# Patient Record
Sex: Female | Born: 1950 | Race: White | Hispanic: No | Smoking: Never smoker
Health system: Southern US, Community
[De-identification: ages and names within clinical notes are randomized; demographics above are authoritative.]

## PROBLEM LIST (undated history)

## (undated) DIAGNOSIS — E785 Hyperlipidemia, unspecified: Secondary | ICD-10-CM

## (undated) DIAGNOSIS — I471 Supraventricular tachycardia, unspecified: Secondary | ICD-10-CM

## (undated) DIAGNOSIS — R195 Other fecal abnormalities: Secondary | ICD-10-CM

## (undated) DIAGNOSIS — I499 Cardiac arrhythmia, unspecified: Secondary | ICD-10-CM

## (undated) DIAGNOSIS — D62 Acute posthemorrhagic anemia: Secondary | ICD-10-CM

## (undated) DIAGNOSIS — K219 Gastro-esophageal reflux disease without esophagitis: Secondary | ICD-10-CM

## (undated) DIAGNOSIS — I10 Essential (primary) hypertension: Secondary | ICD-10-CM

## (undated) DIAGNOSIS — C801 Malignant (primary) neoplasm, unspecified: Secondary | ICD-10-CM

## (undated) DIAGNOSIS — F32A Depression, unspecified: Secondary | ICD-10-CM

## (undated) DIAGNOSIS — Z8619 Personal history of other infectious and parasitic diseases: Secondary | ICD-10-CM

## (undated) DIAGNOSIS — D649 Anemia, unspecified: Secondary | ICD-10-CM

## (undated) DIAGNOSIS — M810 Age-related osteoporosis without current pathological fracture: Secondary | ICD-10-CM

## (undated) DIAGNOSIS — K922 Gastrointestinal hemorrhage, unspecified: Secondary | ICD-10-CM

## (undated) DIAGNOSIS — K5792 Diverticulitis of intestine, part unspecified, without perforation or abscess without bleeding: Secondary | ICD-10-CM

## (undated) HISTORY — PX: COLONOSCOPY: SHX174

## (undated) HISTORY — PX: ESOPHAGOGASTRODUODENOSCOPY: SHX1529

## (undated) HISTORY — PX: TOTAL HIP ARTHROPLASTY: SHX124

## (undated) HISTORY — PX: FRACTURE SURGERY: SHX138

## (undated) HISTORY — PX: JOINT REPLACEMENT: SHX530

## (undated) HISTORY — PX: ANKLE FRACTURE SURGERY: SHX122

## (undated) HISTORY — PX: OTHER SURGICAL HISTORY: SHX169

---

## 1998-03-08 HISTORY — PX: CARDIAC ELECTROPHYSIOLOGY MAPPING AND ABLATION: SHX1292

## 2004-01-17 ENCOUNTER — Ambulatory Visit: Payer: Self-pay | Admitting: Unknown Physician Specialty

## 2004-11-24 ENCOUNTER — Ambulatory Visit: Payer: Self-pay | Admitting: Unknown Physician Specialty

## 2005-03-30 ENCOUNTER — Ambulatory Visit: Payer: Self-pay | Admitting: General Practice

## 2006-04-07 ENCOUNTER — Ambulatory Visit: Payer: Self-pay | Admitting: General Practice

## 2007-08-10 ENCOUNTER — Ambulatory Visit: Payer: Self-pay | Admitting: Unknown Physician Specialty

## 2007-08-23 ENCOUNTER — Ambulatory Visit: Payer: Self-pay | Admitting: Unknown Physician Specialty

## 2008-09-27 ENCOUNTER — Ambulatory Visit: Payer: Self-pay | Admitting: Unknown Physician Specialty

## 2009-04-29 ENCOUNTER — Ambulatory Visit: Payer: Self-pay | Admitting: Unknown Physician Specialty

## 2010-03-10 ENCOUNTER — Ambulatory Visit: Payer: Self-pay | Admitting: Unknown Physician Specialty

## 2010-11-04 ENCOUNTER — Ambulatory Visit: Payer: Self-pay | Admitting: Unknown Physician Specialty

## 2011-12-29 ENCOUNTER — Ambulatory Visit: Payer: Self-pay | Admitting: Internal Medicine

## 2011-12-31 ENCOUNTER — Ambulatory Visit: Payer: Self-pay | Admitting: Internal Medicine

## 2012-01-19 ENCOUNTER — Ambulatory Visit: Payer: Self-pay | Admitting: Obstetrics & Gynecology

## 2012-06-14 ENCOUNTER — Ambulatory Visit: Payer: Self-pay | Admitting: Anesthesiology

## 2012-07-05 ENCOUNTER — Ambulatory Visit: Payer: Self-pay | Admitting: Anesthesiology

## 2012-08-10 ENCOUNTER — Ambulatory Visit: Payer: Self-pay | Admitting: Anesthesiology

## 2012-09-06 ENCOUNTER — Ambulatory Visit: Payer: Self-pay | Admitting: Anesthesiology

## 2012-10-16 ENCOUNTER — Ambulatory Visit: Payer: Self-pay | Admitting: Anesthesiology

## 2013-03-07 ENCOUNTER — Ambulatory Visit: Payer: Self-pay | Admitting: General Practice

## 2013-03-07 DIAGNOSIS — I10 Essential (primary) hypertension: Secondary | ICD-10-CM

## 2013-03-07 LAB — URINALYSIS, COMPLETE
Bacteria: NONE SEEN
Bilirubin,UR: NEGATIVE
Blood: NEGATIVE
Ketone: NEGATIVE
Leukocyte Esterase: NEGATIVE
Ph: 5 (ref 4.5–8.0)
Protein: NEGATIVE
Squamous Epithelial: NONE SEEN
WBC UR: 4 /HPF (ref 0–5)

## 2013-03-07 LAB — BASIC METABOLIC PANEL
Anion Gap: 5 — ABNORMAL LOW (ref 7–16)
Calcium, Total: 9.2 mg/dL (ref 8.5–10.1)
Co2: 28 mmol/L (ref 21–32)
Creatinine: 0.78 mg/dL (ref 0.60–1.30)
EGFR (African American): >60
EGFR (Non-African Amer.): >60
Sodium: 135 mmol/L — ABNORMAL LOW (ref 136–145)

## 2013-03-07 LAB — MRSA PCR SCREENING

## 2013-03-07 LAB — PROTIME-INR: Prothrombin Time: 12.6 secs (ref 11.5–14.7)

## 2013-03-07 LAB — SEDIMENTATION RATE: Erythrocyte Sed Rate: 31 mm/hr — ABNORMAL HIGH (ref 0–30)

## 2013-03-07 LAB — CBC
HCT: 37.9 % (ref 35.0–47.0)
MCH: 27.3 pg (ref 26.0–34.0)
Platelet: 318 10*3/uL (ref 150–440)
RDW: 13.2 % (ref 11.5–14.5)

## 2013-03-08 LAB — URINE CULTURE

## 2013-03-16 ENCOUNTER — Emergency Department: Payer: Self-pay | Admitting: Emergency Medicine

## 2013-03-16 LAB — CBC
HCT: 36.2 % (ref 35.0–47.0)
HGB: 12.2 g/dL (ref 12.0–16.0)
MCH: 27.9 pg (ref 26.0–34.0)
MCHC: 33.7 g/dL (ref 32.0–36.0)
MCV: 83 fL (ref 80–100)
Platelet: 322 10*3/uL (ref 150–440)
RBC: 4.37 10*6/uL (ref 3.80–5.20)
RDW: 13.2 % (ref 11.5–14.5)
WBC: 10.7 10*3/uL (ref 3.6–11.0)

## 2013-03-16 LAB — COMPREHENSIVE METABOLIC PANEL
ALK PHOS: 78 U/L
ALT: 16 U/L (ref 12–78)
Albumin: 3.6 g/dL (ref 3.4–5.0)
Anion Gap: 6 — ABNORMAL LOW (ref 7–16)
BUN: 27 mg/dL — AB (ref 7–18)
Bilirubin,Total: 0.3 mg/dL (ref 0.2–1.0)
CHLORIDE: 101 mmol/L (ref 98–107)
Calcium, Total: 9 mg/dL (ref 8.5–10.1)
Co2: 28 mmol/L (ref 21–32)
Creatinine: 1.07 mg/dL (ref 0.60–1.30)
EGFR (African American): >60
EGFR (Non-African Amer.): 56 — ABNORMAL LOW
Glucose: 99 mg/dL (ref 65–99)
Osmolality: 275 (ref 275–301)
POTASSIUM: 3.7 mmol/L (ref 3.5–5.1)
SGOT(AST): 24 U/L (ref 15–37)
Sodium: 135 mmol/L — ABNORMAL LOW (ref 136–145)
TOTAL PROTEIN: 7.6 g/dL (ref 6.4–8.2)

## 2013-03-16 LAB — CK TOTAL AND CKMB (NOT AT ARMC)
CK, Total: 53 U/L (ref 21–215)
CK-MB: <0.5 ng/mL — ABNORMAL LOW (ref 0.5–3.6)

## 2013-03-16 LAB — PROTIME-INR
INR: 1
Prothrombin Time: 13 secs (ref 11.5–14.7)

## 2013-03-16 LAB — APTT: ACTIVATED PTT: 30.3 s (ref 23.6–35.9)

## 2013-03-16 LAB — TROPONIN I: Troponin-I: <0.02 ng/mL

## 2013-03-21 ENCOUNTER — Inpatient Hospital Stay: Payer: Self-pay | Admitting: General Practice

## 2013-03-22 LAB — BASIC METABOLIC PANEL
Anion Gap: 3 — ABNORMAL LOW (ref 7–16)
BUN: 15 mg/dL (ref 7–18)
CALCIUM: 8.1 mg/dL — AB (ref 8.5–10.1)
CHLORIDE: 104 mmol/L (ref 98–107)
Co2: 29 mmol/L (ref 21–32)
Creatinine: 0.68 mg/dL (ref 0.60–1.30)
EGFR (African American): >60
Glucose: 105 mg/dL — ABNORMAL HIGH (ref 65–99)
Osmolality: 273 (ref 275–301)
POTASSIUM: 4.4 mmol/L (ref 3.5–5.1)
SODIUM: 136 mmol/L (ref 136–145)

## 2013-03-22 LAB — HEMOGLOBIN: HGB: 8.8 g/dL — ABNORMAL LOW (ref 12.0–16.0)

## 2013-03-22 LAB — PLATELET COUNT: Platelet: 222 10*3/uL (ref 150–440)

## 2013-03-23 LAB — PLATELET COUNT: PLATELETS: 213 10*3/uL (ref 150–440)

## 2013-03-23 LAB — PATHOLOGY REPORT

## 2013-03-23 LAB — BASIC METABOLIC PANEL
Anion Gap: 4 — ABNORMAL LOW (ref 7–16)
BUN: 10 mg/dL (ref 7–18)
CO2: 28 mmol/L (ref 21–32)
Calcium, Total: 8.2 mg/dL — ABNORMAL LOW (ref 8.5–10.1)
Chloride: 105 mmol/L (ref 98–107)
Creatinine: 0.66 mg/dL (ref 0.60–1.30)
EGFR (Non-African Amer.): >60
GLUCOSE: 99 mg/dL (ref 65–99)
OSMOLALITY: 273 (ref 275–301)
Potassium: 3.5 mmol/L (ref 3.5–5.1)
Sodium: 137 mmol/L (ref 136–145)

## 2013-03-23 LAB — HEMOGLOBIN: HGB: 8.5 g/dL — ABNORMAL LOW (ref 12.0–16.0)

## 2013-06-28 ENCOUNTER — Encounter: Payer: Self-pay | Admitting: General Practice

## 2013-07-06 ENCOUNTER — Encounter: Payer: Self-pay | Admitting: General Practice

## 2013-08-06 ENCOUNTER — Encounter: Payer: Self-pay | Admitting: General Practice

## 2013-12-19 ENCOUNTER — Ambulatory Visit: Payer: Self-pay | Admitting: Internal Medicine

## 2014-03-28 DIAGNOSIS — R195 Other fecal abnormalities: Secondary | ICD-10-CM

## 2014-03-28 HISTORY — DX: Other fecal abnormalities: R19.5

## 2014-05-06 ENCOUNTER — Ambulatory Visit: Payer: Self-pay | Admitting: Unknown Physician Specialty

## 2014-06-29 NOTE — Op Note (Signed)
PATIENT NAME:  Jodi Allison, Jodi Allison MR#:  [id number] DATE OF BIRTH:  1951/02/24  DATE OF PROCEDURE:  03/21/2013  PREOPERATIVE DIAGNOSIS: Degenerative arthrosis of the left hip.   POSTOPERATIVE DIAGNOSIS: Degenerative arthrosis of the left knee.   PROCEDURE PERFORMED: Left total hip arthroplasty.   SURGEON: Laurice Record. Hooten, M.D.   ASSISTANT:  Vance Peper, PA (required to maintain retraction throughout the procedure).   ANESTHESIA: Spinal.   ESTIMATED BLOOD LOSS: 250 mL.   FLUIDS REPLACED: 2300 mL of crystalloid.   DRAINS: Two medium drains to a Hemovac reservoir.   IMPLANTS UTILIZED: DePuy 13.5 mm small stature AML femoral stem, 50 mm outer diameter Pinnacle 100 acetabular component, a +4 mm 10-degree Pinnacle Marathon polyethylene liner and a 32 mm cobalt chrome hip ball with a +1 mm neck length.   INDICATIONS FOR SURGERY: The patient is a 64 year old female who has been seen for complaints of progressive left hip and groin pain. X-rays demonstrated severe degenerative changes with full-thickness loss of cartilage superiorly and significant osteophyte formation. After discussion of the risks and benefits of surgical intervention, the patient expressed understanding of the risks, benefits and agreed with plans for surgical intervention.   PROCEDURE IN DETAIL: The patient was brought into the operating room and, after adequate spinal anesthesia was achieved, the patient was placed in a right lateral decubitus position. Axillary roll was placed, and all bony prominences were well padded. The patient's left hip and leg were cleaned and prepped with alcohol and DuraPrep draped in the usual sterile fashion. A "timeout" was performed as per usual protocol. A lateral curvilinear incision was made, gently curving towards the posterior superior iliac spine. IT band was incised in line with the skin incisions. Fibers of the gluteus maximus were split in line. Piriformis tendon was identified, skeletonized and  incised at its insertion of the proximal femur and reflected posteriorly. In a similar fashion, the short external rotators were incised and reflected posteriorly. A T-type posterior capsulotomy was performed. Prior to dislocation of the femoral head, a threaded Steinmann pin was inserted through a separate stab incision into the pelvis superior to the acetabulum and then bent in the form of a stylus so as to assess limb length and hip offset throughout the procedure. The femoral head was then dislocated posteriorly. Inspection of the femoral head demonstrated severe degenerative changes. Femoral neck cut was performed using an oscillating saw. The anterior capsule was elevated off of the femoral neck. Attention was then directed to the acetabulum. The remnant of labrum was excised using electrocautery. Inspection of the acetabulum demonstrated significant degenerative changes with prominent anterior osteophytes. The acetabulum was reamed in a sequential fashion up to a 49 mm diameter. This allowed for excellent punctate bleeding bone. A 50 mm outer diameter Pinnacle 100 acetabular component was positioned and impacted into place. Excellent scratch fit was appreciated. A +4 neutral polyethylene was inserted provisionally, and attention was redirected to the proximal femur. Pilot hole for reaming of the proximal femoral canal was created using a high-speed bur. Proximal femoral canal was reamed in a sequential fashion up to a 13 mm diameter. This allowed for approximately 5.5 cm of scratch fit. The proximal portion of the femur was then prepared using a 13.5 mm aggressive side-biting reamer. Serial broaches were inserted up to a 13.5 mm small stature broach. The calcar region was planed and trial reduction was performed with a 32 mm hip ball with a +1 mm neck segment. Excellent stability was noted. However,  it was felt that there was slight lengthening of leg. Trial components were removed and an additional cut was  made using an oscillating saw. Broach was countersunk and calcar in area was planed accordingly. Trial reduction was again performed with a 32 mm hip ball with a +1 mm neck segment. Good maintenance of hip offset and equalization of limb lengths was appreciated. Excellent stability was appreciated both anteriorly and posteriorly. However, it was elected to trial with the +4 mm 10-degree offset with high side at approximately 4 o'clock position. This allowed for improved posterior stability with extremes of adduction, flexion and internal rotation. Trial components were removed. The acetabular shell was irrigated with normal saline with antibiotic solution and then suctioned dry. A +4 mm 10-degree Pinnacle Marathon polyethylene liner was positioned and impacted into place with high side at approximately 4 o'clock position. Next, a 13.5 mm small stature AML femoral component was positioned and impacted into place. Excellent scratch fit was appreciated. The Morse taper was cleaned and dried. A 32 mm cobalt chrome hip ball with a +1 mm neck length was placed on the trunnion and impacted into place. The hip was reduced and placed through a range of motion. Excellent stability was appreciated both anteriorly and posteriorly. Good equalization of limb lengths and hip offset was appreciated. The wound was irrigated with copious amounts of normal saline with antibiotic solution using pulsatile lavage and then suctioned dry. Good hemostasis was appreciated. The posterior capsulotomy was repaired using #5 Ethibond. The piriformis tendon was reapproximated on the surface of the gluteus medius tendon using #5 Ethibond. Two medium drains were placed in the wound bed and brought out through a separate stab incision to be attached to a Hemovac reservoir. IT band was repaired using interrupted sutures of #1 Vicryl. The subcutaneous tissue was approximated in  layers using first #0 Vicryl followed by 2-0 Vicryl. Skin was closed with  skin staples. A sterile dressing was applied.   The patient tolerated the procedure well. She was transported to the recovery room in stable condition.     ____________________________ Laurice Record. Holley Bouche., MD jph:dmm D: 03/21/2013 20:44:00 ET T: 03/21/2013 21:25:26 ET JOB#: [id number]  cc: Laurice Record. Holley Bouche., MD, <Dictator> Laurice Record Holley Bouche MD ELECTRONICALLY SIGNED 03/25/2013 23:46

## 2014-06-29 NOTE — Discharge Summary (Signed)
PATIENT NAME:  Jodi Allison, Jodi Allison MR#:  [id number] DATE OF BIRTH:  1950-08-21  DATE OF ADMISSION:  03/21/2013 DATE OF DISCHARGE: 03/23/2013   ADMITTING DIAGNOSIS: Degenerative arthrosis of the left hip.   DISCHARGE DIAGNOSIS: Degenerative arthrosis of the left hip.   HISTORY: The patient is a 64 year old, who has been followed at [facility] for progression of left hip pain. The patient reported an approximately 2 year history of progressive left groin and hip pain. The pain was noted to be aggravated with weight-bearing activities. She has also noticed some decrease in her left hip range of motion. The pain tended to be aggravated by arising from a sitting position as well as prolonged weight-bearing activities. The patient states that she has developed a significant limp. On occasion, the patient had gone to using a cane for ambulation. She had not seen any significant improvement in her condition despite activity modification as well as use of anti-inflammatory medications, specifically Meloxicam. The patient states that the pain had progressed to the point that it was significantly interfering with her activities of daily living. X-rays  taken in [place] showed slight hyperplastic acetabulum. She was noted to be edge loading superiorly on the lateral acetabulum. She was noted to be bone-on-bone. Significant narrowing was noted. There was significant subchondral sclerosis as well as cystic changes and osteophyte formation. After discussion of the risks and benefits of surgical intervention, the patient expressed her understanding of the risks and benefits and agreed with plans surgical intervention.   PROCEDURE: Left total hip arthroplasty.   ANESTHESIA: Spinal.   IMPLANTS UTILIZED: DePuy 13.5 mm small stature AML femoral stem, 50 mm outer diameter Pinnacle 100 acetabular component, a +4 mm 10 degree Pinnacle Marathon polyethylene liner and a 32 mm cobalt chrome hip ball with a + 1  mm neck length.   HOSPITAL COURSE: The patient tolerated the procedure very well. She had no complications. She was then taken to the PAC-U where she was stabilized and then transferred to the orthopedic floor. The patient began receiving anticoagulation therapy of Lovenox 30 mg subcutaneous q. 12 hours per anesthesia and pharmacy protocol. She was fitted with TED stockings bilaterally. These were allowed to be removed 1 hour per 8 hour shift. The patient was also fitted with the AV-I compression foot pumps set at 80 mmHg. Her calves have been nontender. There has been no evidence of any DVTs. Negative Homans sign. Heels were elevated off the bed using rolled towels. Sacral pad was applied preoperatively.   The patient has denied any chest pain or shortness of breath. Vital signs have been stable. She was noted to have a slight decrease in her blood pressure initially, but this returned to normal very quickly without any modification of her medications or fluids. Hemodynamically, she was stable. No transfusions were needed.   Physical therapy was initiated on day 1 for gait training and transfers. Upon being discharged, she was ambulating greater than 200 feet. She was able go up and down 4 sets of steps. She was independent with bed to chair transfers. Occupational therapy was also initiated on day 1 for ADLs and assistive devices.   The patient's IV, Foley and Hemovac discontinued on day 2 along with a dressing change. The wound was free of any drainage or any signs of infection.   DISPOSITION: The patient is being discharged to home in improved stable condition.   DISCHARGE INSTRUCTIONS: She will continue weight-bearing as tolerated. Continue to use a walker until  cleared by physical therapy to go to a quad cane. She will receive home health PT. Continue with TED stockings bilaterally. These are allowed to be removed at night, but are to be worn during the day. Elevate the lower extremity,  specifically heels off the bed. Encourage the patient to continue with incentive spirometer q.1 hour while awake as well as cough, deep breathing q.2 hours while awake. Posterior hip precautions were explained to the patient. She is placed on a regular diet. Staples are to be removed on 01/28 following the application of benzoin and Steri-Strips. Change dressing as needed. She is not to take a shower until staples are removed. She does have a followup appointment in [facility] on February 26 at 10:15.  She is to call the clinic sooner if any temperatures of 101.5 or greater or excessive bleeding. She is to resume her regular medication that she was on prior to admission. She was given a prescription for Lovenox 40 mg subcutaneously daily for 14 days, then discontinue and begin taking one 81 mg enteric-coated aspirin. A prescription for oxycodone 5 to 10 mg every q.4 to 6 hours p.r.n. for pain, tramadol 50 to 100 mg q.4 to 6 hours p.r.n. for pain and Tylenol ES may be given as well.   PAST MEDICAL HISTORY:  1.  Shingles.  2.  Hypertension.  3. SVT, treated with AV node ablation.  4.  Osteoporosis.  ____________________________ Vance Peper, PA [email or web address]:aw D: 03/23/2013 07:32:10 ET T: 03/23/2013 07:46:40 ET JOB#: [id number]  cc: Vance Peper, PA, <Dictator> [place]raine PA ELECTRONICALLY SIGNED 03/27/2013 7:29

## 2014-06-29 NOTE — Discharge Summary (Signed)
ADDENDUM  PATIENT NAME:  Jodi Allison, Jodi Allison MR#:  [id number] DATE OF BIRTH:  [birth year]  DATE OF ADMISSION:  03/21/2013 DATE OF DISCHARGE:  03/24/2013  The patient's date of discharge was dictated as 03/23/2013 and it should have been 03/24/2013. ____________________________ Vance Peper, PA jrw:aw D: 03/30/2013 08:16:40 ET T: 03/30/2013 08:29:26 ET JOB#: [id number]  cc: Vance Peper, PA, <Dictator> Temari Schooler PA ELECTRONICALLY SIGNED 04/03/2013 7:12

## 2014-11-13 ENCOUNTER — Other Ambulatory Visit: Payer: Self-pay | Admitting: Internal Medicine

## 2014-11-13 DIAGNOSIS — Z1231 Encounter for screening mammogram for malignant neoplasm of breast: Secondary | ICD-10-CM

## 2014-12-26 ENCOUNTER — Ambulatory Visit
Admission: RE | Admit: 2014-12-26 | Discharge: 2014-12-26 | Disposition: A | Discharge status: Home / Self Care | Payer: 59 | Source: Ambulatory Visit | Attending: Internal Medicine | Admitting: Internal Medicine

## 2014-12-26 ENCOUNTER — Other Ambulatory Visit: Payer: Self-pay | Admitting: Internal Medicine

## 2014-12-26 DIAGNOSIS — Z1231 Encounter for screening mammogram for malignant neoplasm of breast: Secondary | ICD-10-CM | POA: Insufficient documentation

## 2014-12-26 DIAGNOSIS — N63 Unspecified lump in breast: Secondary | ICD-10-CM | POA: Insufficient documentation

## 2015-01-21 IMAGING — CT CT HEAD WITHOUT CONTRAST
1 series · 16 of 29 positions shown, 20 images · non-contrast
Comparison: None.

CLINICAL DATA: Syncopal episode

EXAM:
CT HEAD WITHOUT CONTRAST
TECHNIQUE: Contiguous axial images were obtained from the base of the skull
through the vertex without intravenous contrast.

[Series 2: soft tissue · axial · 0.42mm/px · z∈[-142,-12]mm · 16 of 29 slices shown, 20 images]
[im 2/29  brain]
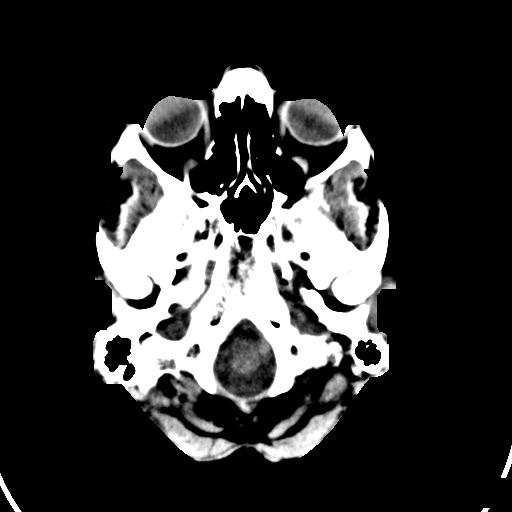
[im 2/29  bone]
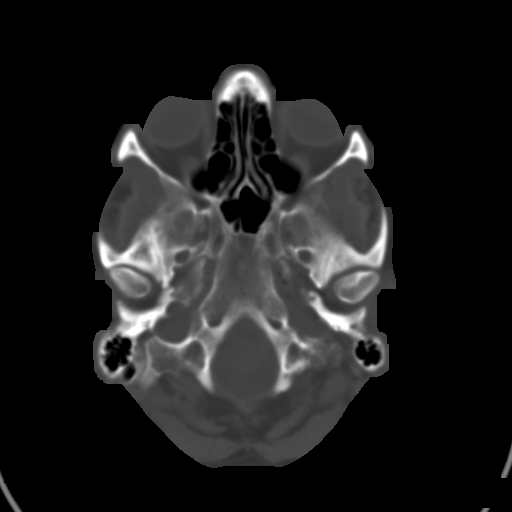
[im 4/29  brain]
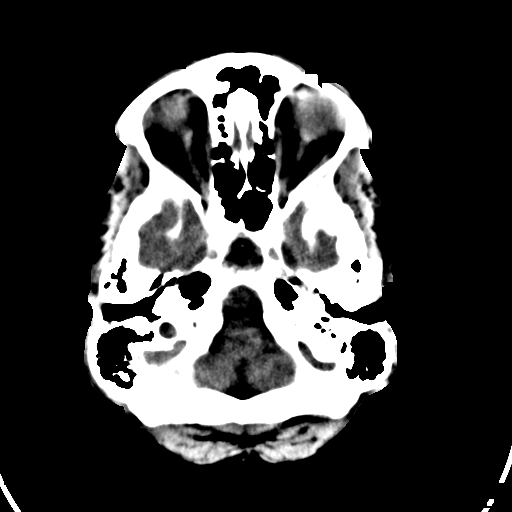
[im 6/29  brain]
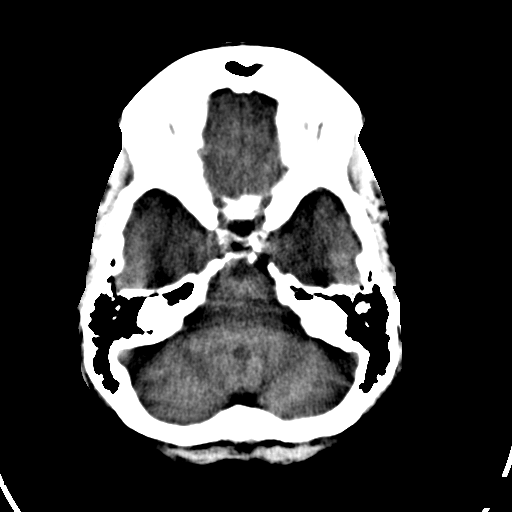
[im 7/29  brain]
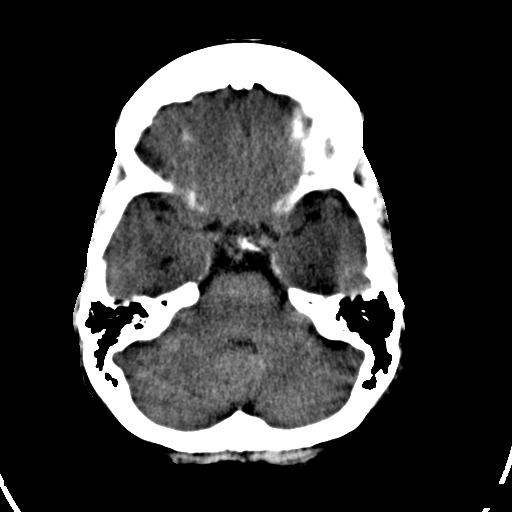
[im 9/29  brain]
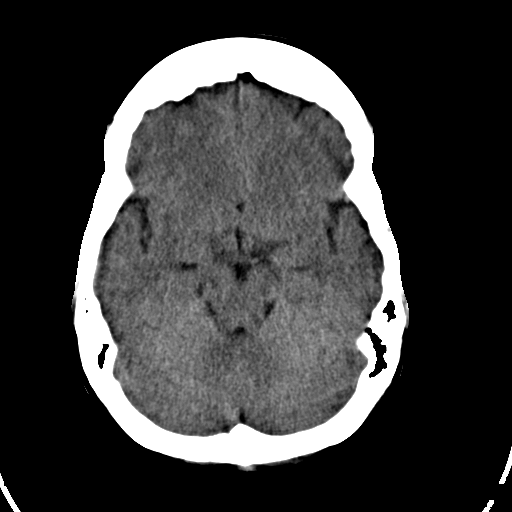
[im 9/29  bone]
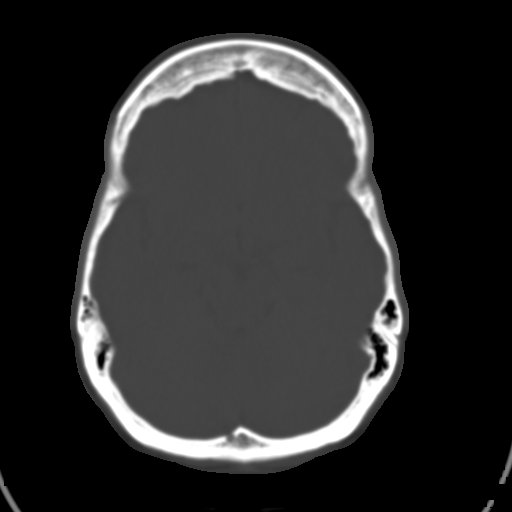
[im 11/29  brain]
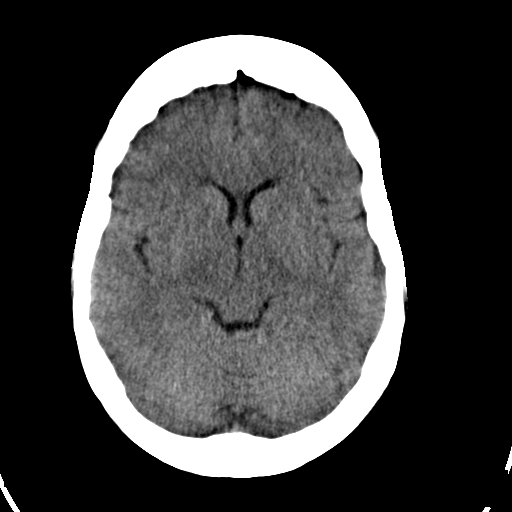
[im 12/29  brain]
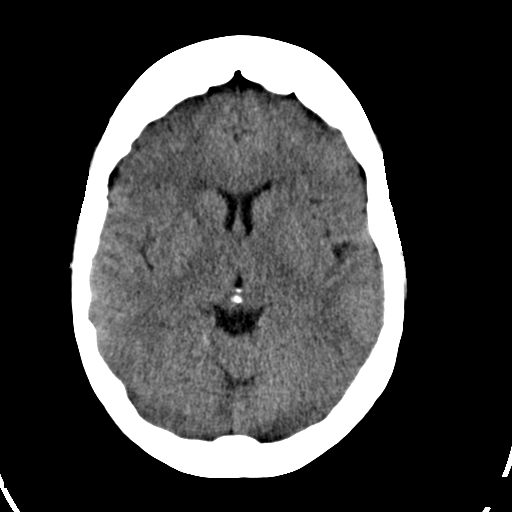
[im 14/29  brain]
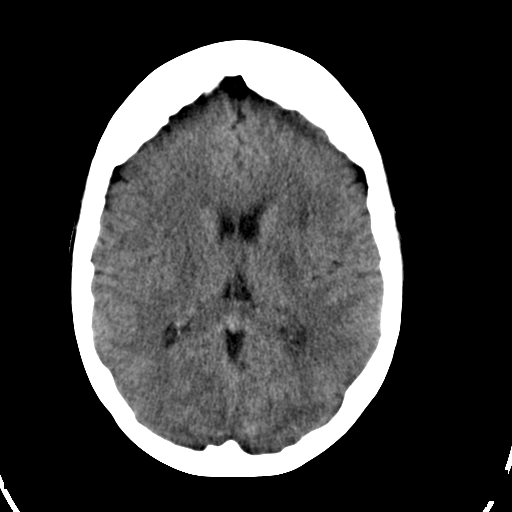
[im 16/29  brain]
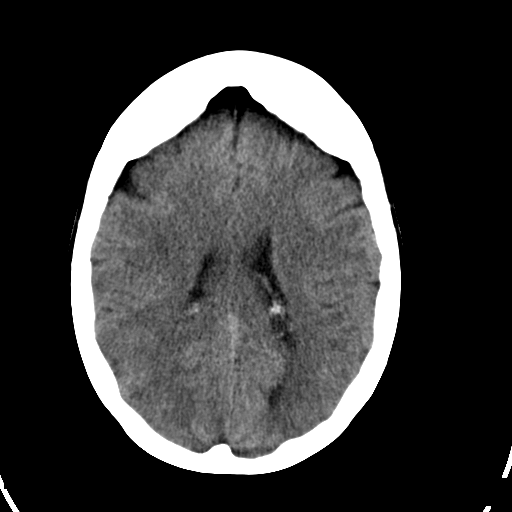
[im 16/29  bone]
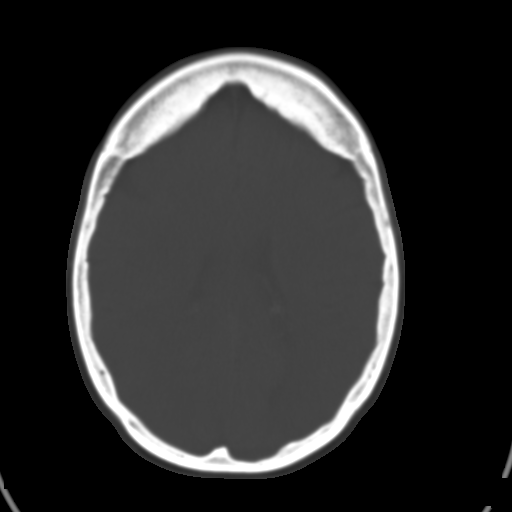
[im 18/29  brain]
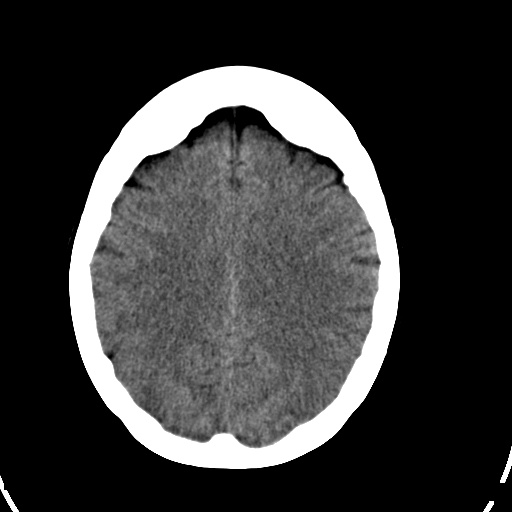
[im 19/29  brain]
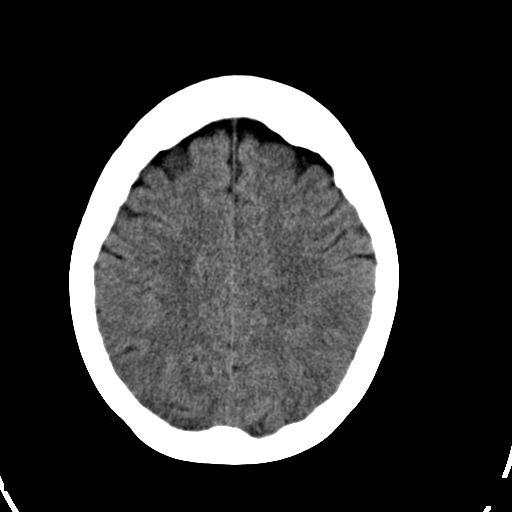
[im 21/29  brain]
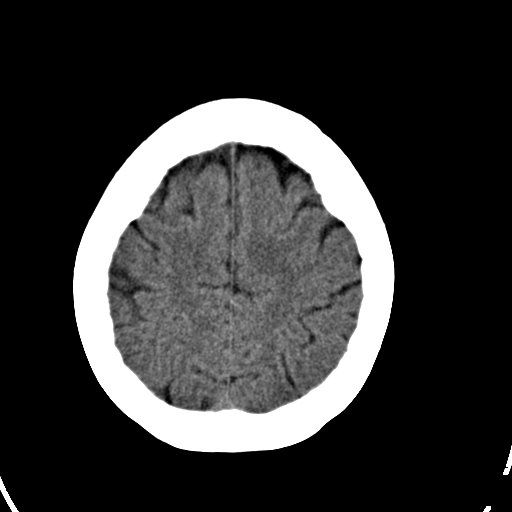
[im 23/29  brain]
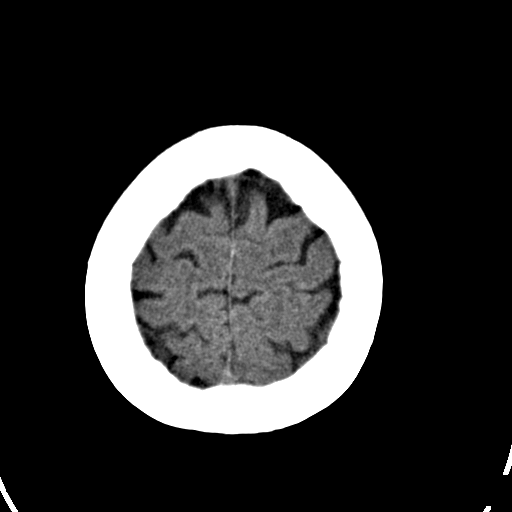
[im 23/29  bone]
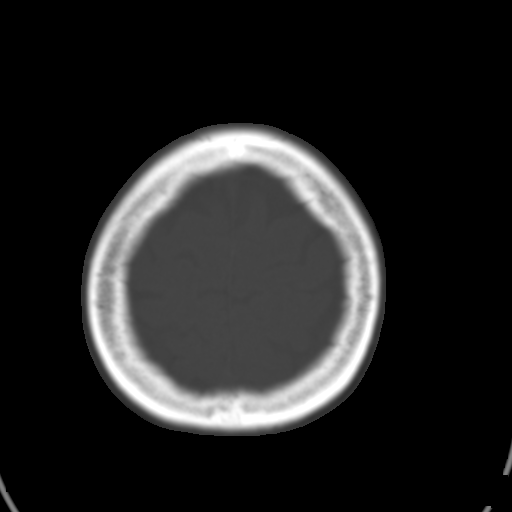
[im 24/29  brain]
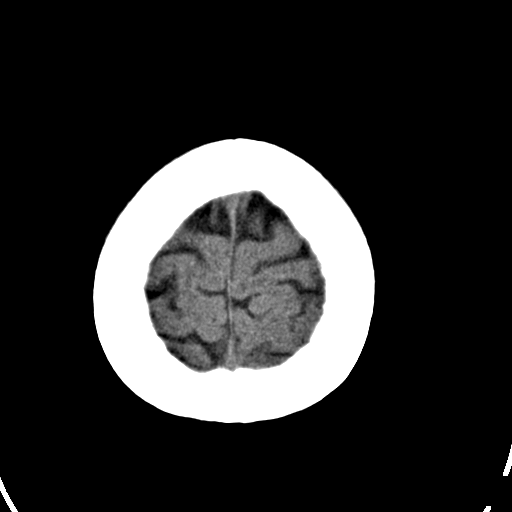
[im 26/29  brain]
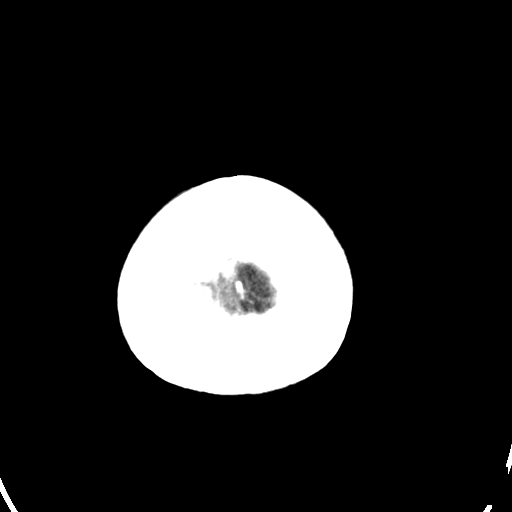
[im 28/29  brain]
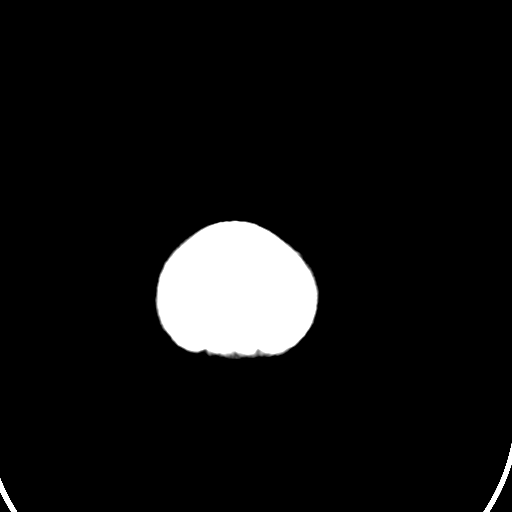

[16 of 29 positions shown; findings below may reference images not displayed]

FINDINGS: Bony calvarium is intact. The ventricles are of normal size and
configuration. Some scattered areas of decreased attenuation are
noted in the region of the anterior limb of the internal capsule
consistent with prior lacunar infarct. No findings to suggest acute
hemorrhage, acute infarction or space-occupying mass lesion are
noted.
IMPRESSION: Chronic changes without acute abnormality.

## 2015-01-21 IMAGING — CR DG CHEST 1V PORT
1 series · 1 of 1 positions shown · non-contrast
Comparison: None.

CLINICAL DATA: Chest pain

EXAM:
PORTABLE CHEST - 1 VIEW

[ap]
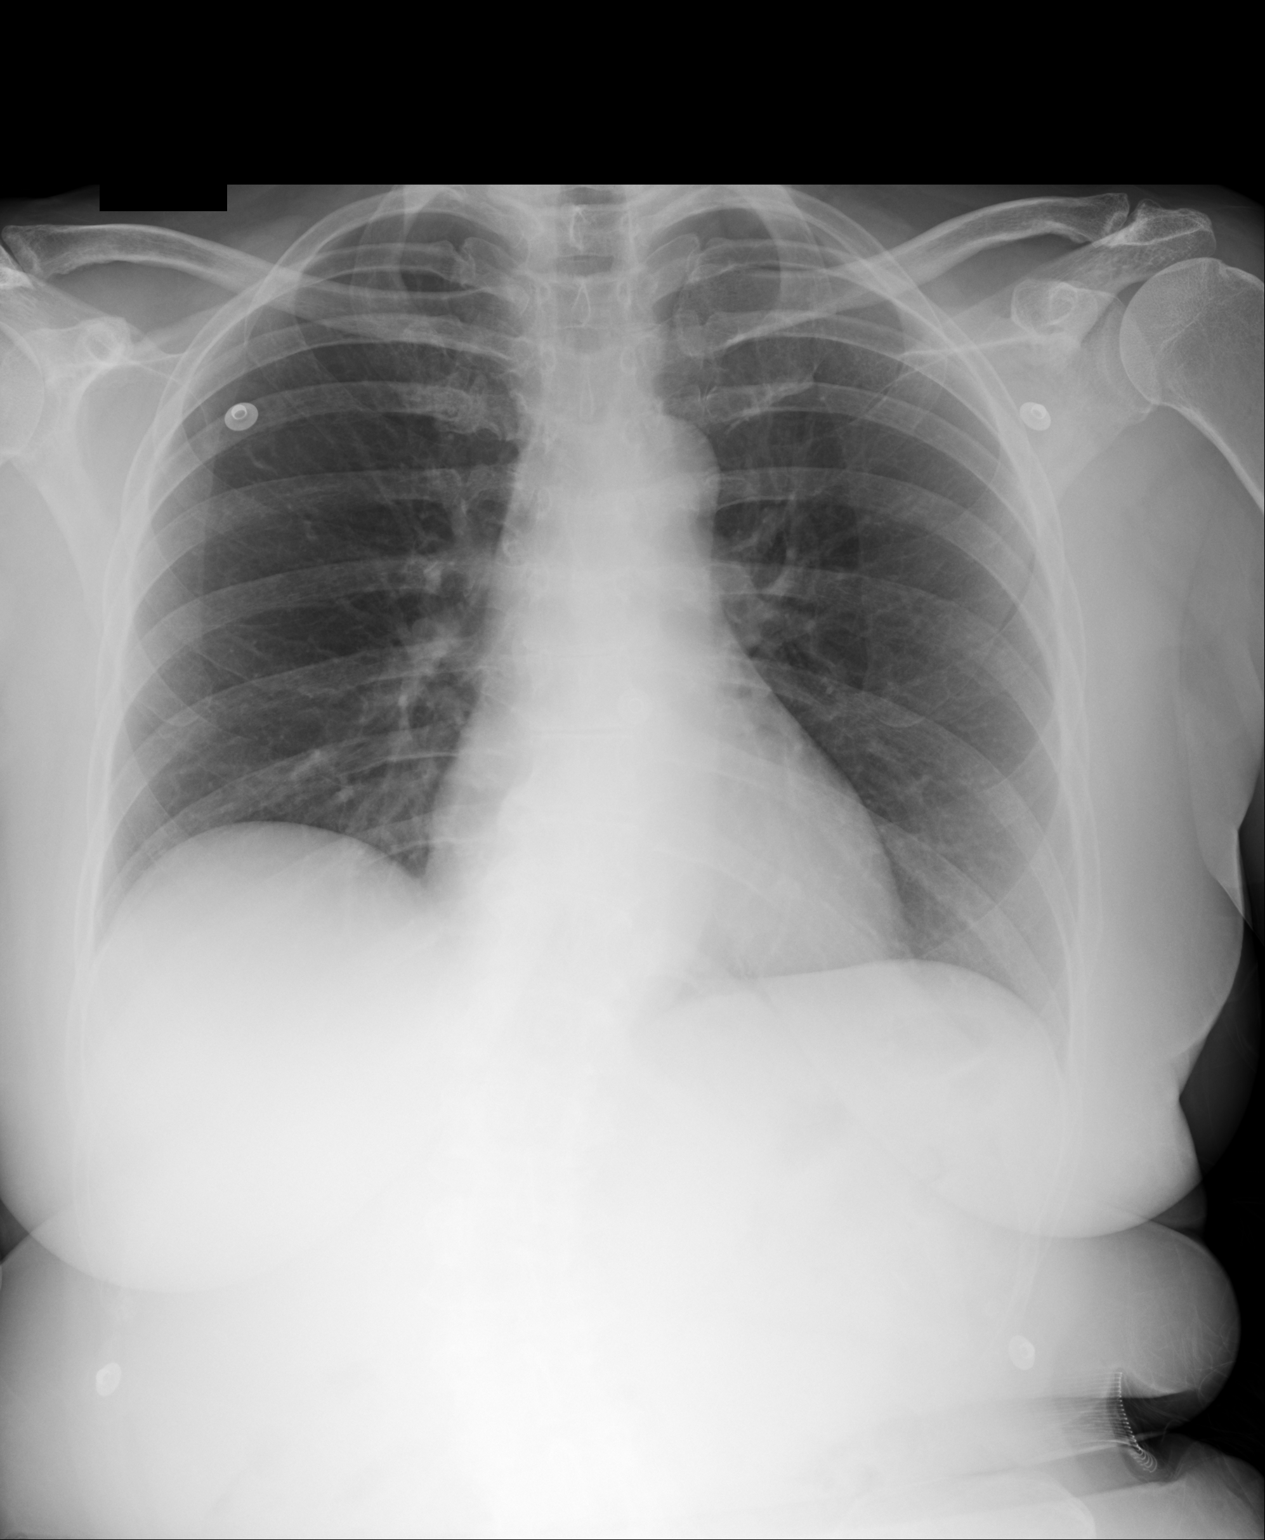

[1 of 1 positions shown; findings below may reference images not displayed]

FINDINGS: The lungs are clear and negative for focal airspace consolidation,
pulmonary edema or suspicious pulmonary nodule. No pleural effusion
or pneumothorax. Cardiac and mediastinal contours are within normal
limits. No acute fracture or lytic or blastic osseous lesions.
Dextro convex scoliosis of the lumbar spine. The visualized upper
abdominal bowel gas pattern is unremarkable.
IMPRESSION: No active cardiopulmonary disease.

## 2015-01-26 IMAGING — CR DG HIP COMPLETE 2+V*L*
1 series · 2 of 2 positions shown · non-contrast
Comparison: None

CLINICAL DATA: Postop left total hip arthroplasty

EXAM:
LEFT HIP - COMPLETE 2+ VIEW

[Series 1: ap · 0.17mm/px · 2 of 2 slices shown]
[im 1/2]
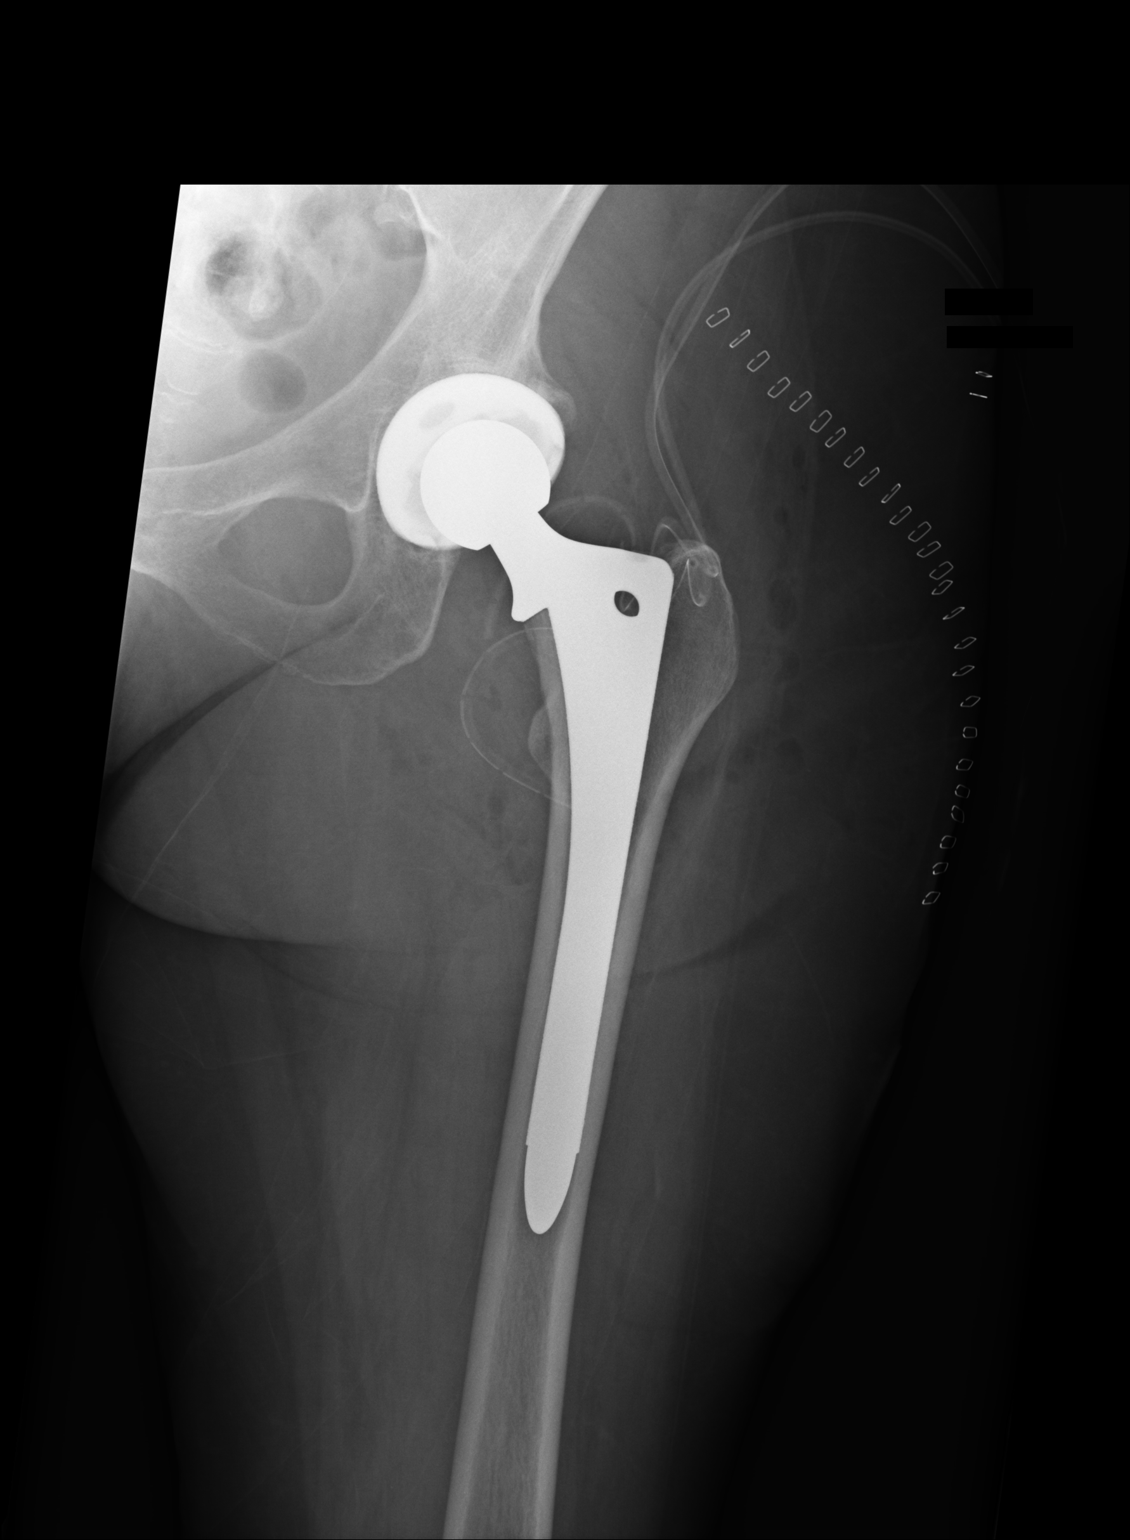
[im 2/2]
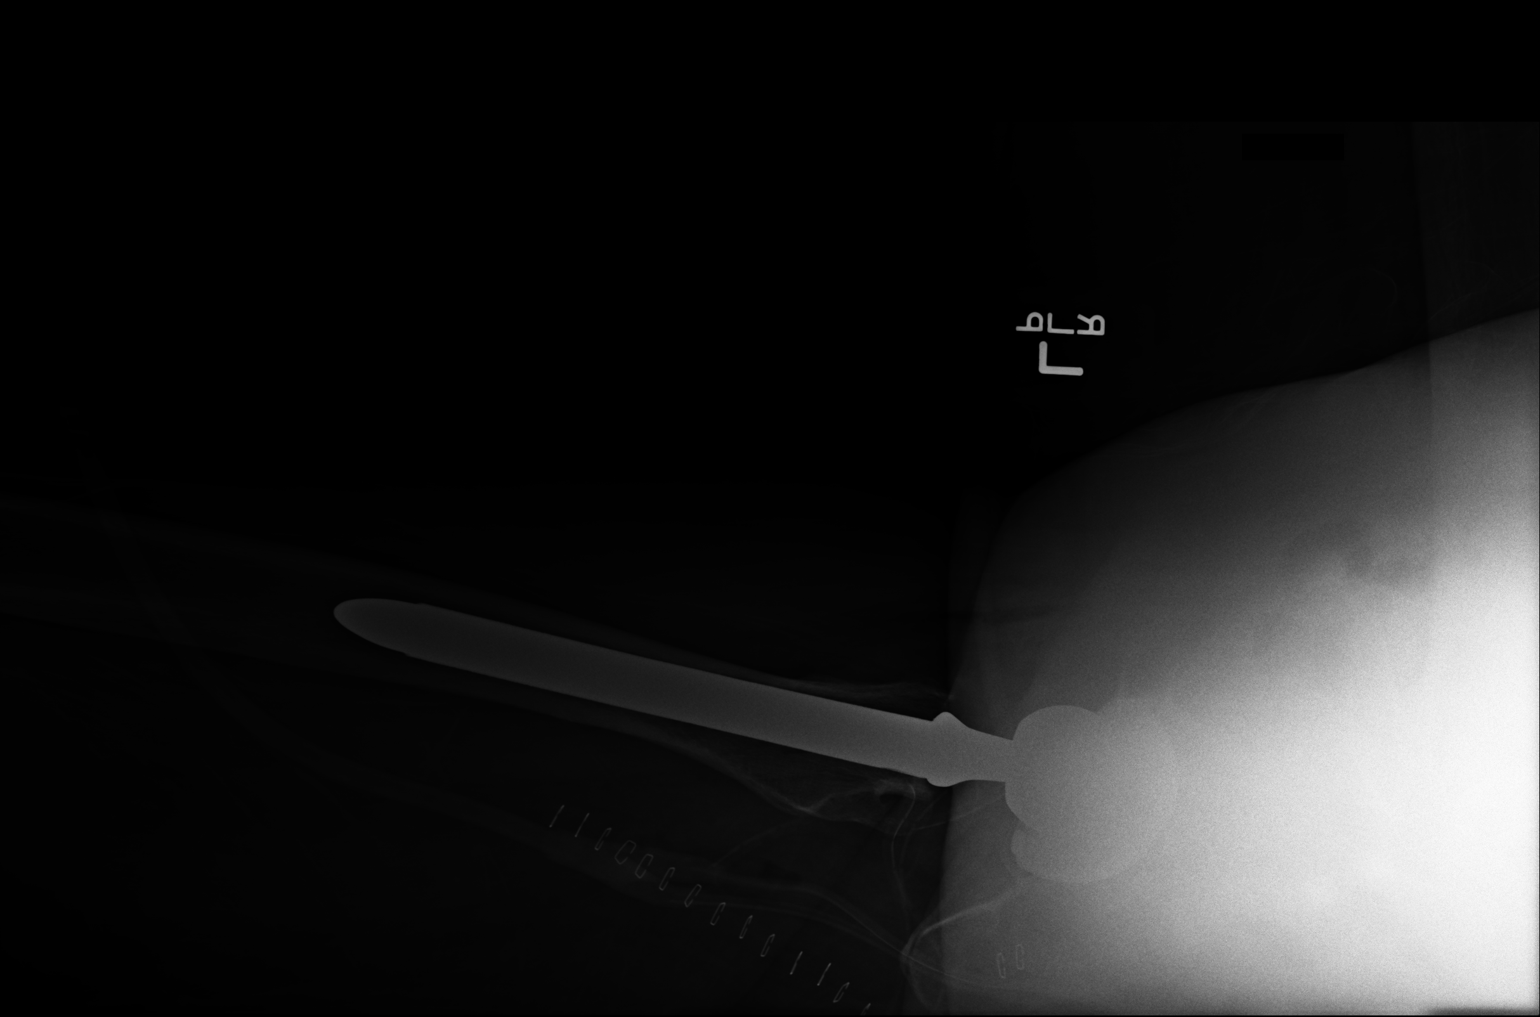

[2 of 2 positions shown; findings below may reference images not displayed]

FINDINGS: Postoperative changes from a left hip arthroplasty or noted. The
hardware components are in anatomic alignment and no complications
identified. Surgical drain overlies the proximal left femur.
IMPRESSION: 1. Status post left total hip arthroplasty.

## 2015-04-01 DIAGNOSIS — Z96642 Presence of left artificial hip joint: Secondary | ICD-10-CM | POA: Diagnosis not present

## 2016-01-06 ENCOUNTER — Other Ambulatory Visit: Payer: Self-pay | Admitting: Internal Medicine

## 2016-01-06 DIAGNOSIS — Z1231 Encounter for screening mammogram for malignant neoplasm of breast: Secondary | ICD-10-CM

## 2016-02-10 ENCOUNTER — Ambulatory Visit
Admission: RE | Admit: 2016-02-10 | Discharge: 2016-02-10 | Disposition: A | Discharge status: Home / Self Care | Payer: BLUE CROSS/BLUE SHIELD | Source: Ambulatory Visit | Attending: Internal Medicine | Admitting: Internal Medicine

## 2016-02-10 DIAGNOSIS — Z1231 Encounter for screening mammogram for malignant neoplasm of breast: Secondary | ICD-10-CM | POA: Diagnosis not present

## 2016-12-01 ENCOUNTER — Other Ambulatory Visit: Payer: Self-pay | Admitting: Internal Medicine

## 2016-12-01 DIAGNOSIS — Z1231 Encounter for screening mammogram for malignant neoplasm of breast: Secondary | ICD-10-CM

## 2017-01-01 DIAGNOSIS — Z96649 Presence of unspecified artificial hip joint: Secondary | ICD-10-CM | POA: Insufficient documentation

## 2017-01-01 DIAGNOSIS — R600 Localized edema: Secondary | ICD-10-CM | POA: Diagnosis present

## 2017-01-01 DIAGNOSIS — I10 Essential (primary) hypertension: Secondary | ICD-10-CM | POA: Insufficient documentation

## 2017-01-01 DIAGNOSIS — Z79899 Other long term (current) drug therapy: Secondary | ICD-10-CM | POA: Diagnosis not present

## 2017-01-01 DIAGNOSIS — T783XXA Angioneurotic edema, initial encounter: Secondary | ICD-10-CM | POA: Diagnosis not present

## 2017-01-01 NOTE — ED Triage Notes (Signed)
Patient reports woke with swelling to her face.  Noted swelling to left side of mouth and into jaw.

## 2017-01-02 ENCOUNTER — Emergency Department
Admission: EM | Admit: 2017-01-02 | Discharge: 2017-01-02 | Disposition: A | Discharge status: Home / Self Care | Payer: Medicare HMO | Attending: Emergency Medicine | Admitting: Emergency Medicine

## 2017-01-02 ENCOUNTER — Encounter: Payer: Self-pay | Admitting: Emergency Medicine

## 2017-01-02 DIAGNOSIS — T783XXA Angioneurotic edema, initial encounter: Secondary | ICD-10-CM

## 2017-01-02 HISTORY — DX: Essential (primary) hypertension: I10

## 2017-01-02 MED ORDER — FAMOTIDINE 40 MG PO TABS: 40.0000 mg | ORAL_TABLET | Freq: Every evening | ORAL | 0 refills | Status: DC

## 2017-01-02 MED ORDER — DIPHENHYDRAMINE HCL 50 MG/ML IJ SOLN
25.0000 mg | Freq: Once | INTRAMUSCULAR | Status: AC
  Administered 2017-01-02: 25 mg via INTRAVENOUS
  Filled 2017-01-02: qty 1

## 2017-01-02 MED ORDER — METHYLPREDNISOLONE SODIUM SUCC 125 MG IJ SOLR
125.0000 mg | Freq: Once | INTRAMUSCULAR | Status: AC
  Administered 2017-01-02: 125 mg via INTRAVENOUS
  Filled 2017-01-02: qty 2

## 2017-01-02 MED ORDER — DIPHENHYDRAMINE HCL 25 MG PO CAPS: 25.0000 mg | ORAL_CAPSULE | ORAL | 0 refills | Status: DC | PRN

## 2017-01-02 MED ORDER — FAMOTIDINE IN NACL 20-0.9 MG/50ML-% IV SOLN
20.0000 mg | Freq: Once | INTRAVENOUS | Status: AC
  Administered 2017-01-02: 20 mg via INTRAVENOUS
  Filled 2017-01-02: qty 50

## 2017-01-02 MED ORDER — PREDNISONE 20 MG PO TABS: 60.0000 mg | ORAL_TABLET | Freq: Every day | ORAL | 0 refills | Status: DC

## 2017-01-02 NOTE — ED Provider Notes (Signed)
[facility] Emergency Department Provider Note   ____________________________________________   First MD Initiated Contact with Patient 01/01/17 2355     (approximate)  I have reviewed the triage vital signs and the nursing notes.   HISTORY  Chief Complaint Facial Swelling    HPI Jodi Allison is a 66 y.o. female who comes into the hospital today with some right-sided facial swelling. She states this started around 11 PM. She was laying on her right side and she felt her mouth twitch. She then put her hand to her face and it felt swollen. She states the Feeling funny so she got up and looked in the mirror and noticed that her face was swollen. The patient reports that she's had similar reactions lisinopril and will start in the past. She denies any swelling of her tongue or throat. She has no shortness of breath. The patient did not take any medications before she came into the hospital. She reports that the previous reactions were only to her lips but she hasn't eaten or drank anything new or strange that she is aware of. Here today for evaluation.   Past Medical History:  Diagnosis Date  . Hypertension     There are no active problems to display for this patient.   Past Surgical History:  Procedure Laterality Date  . ANKLE FRACTURE SURGERY    . TOTAL HIP ARTHROPLASTY      Prior to Admission medications   Medication Sig Start Date End Date Taking? Authorizing Provider  diphenhydrAMINE (BENADRYL) 25 mg capsule Take 1 capsule (25 mg total) by mouth every 4 (four) hours as needed. 01/02/17 01/02/18  Loney Hering, MD  famotidine (PEPCID) 40 MG tablet Take 1 tablet (40 mg total) by mouth every evening. 01/02/17 01/02/18  Loney Hering, MD  predniSONE (DELTASONE) 20 MG tablet Take 3 tablets (60 mg total) by mouth daily. 01/02/17   Loney Hering, MD    Allergies Lisinopril; Losartan; Penicillins; and Sulfa antibiotics  Family History    Problem Relation Age of Onset  . Breast cancer Mother     Social History Social History  Substance Use Topics  . Smoking status: Never Smoker  . Smokeless tobacco: Not on file  . Alcohol use Yes    Review of Systems  Constitutional: No fever/chills Eyes: No visual changes. ENT: lips and facial swelling Cardiovascular: Denies chest pain. Respiratory: Denies shortness of breath. Gastrointestinal: No abdominal pain.  No nausea, no vomiting.  No diarrhea.  No constipation. Genitourinary: Negative for dysuria. Musculoskeletal: Negative for back pain. Skin: Negative for rash. Neurological: Negative for headaches, focal weakness or numbness.   ____________________________________________   PHYSICAL EXAM:  VITAL SIGNS: ED Triage Vitals  Enc Vitals Group     BP 01/01/17 2347 (!) 183/78     Pulse Rate 01/01/17 2347 100     Resp 01/01/17 2347 20     Temp 01/01/17 2347 98.6 F (37 C)     Temp Source 01/01/17 2347 Oral     SpO2 01/01/17 2347 99 %     Weight 01/01/17 2348 135 lb (61.2 kg)     Height 01/01/17 2348 5\' 4"  (1.626 m)     Head Circumference --      Peak Flow --      Pain Score --      Pain Loc --      Pain Edu? --      Excl. in [place]? --  Constitutional: Alert and oriented. Well appearing and in moderate distress. Eyes: Conjunctivae are normal. PERRL. EOMI. Head: Atraumatic. Nose: No congestion/rhinnorhea. Mouth/Throat: Swelling to right corner of mouth involving upper and lower lips, does not extend past midline, right facial swelling, no tongue swelling. Mucous membranes are moist.  Oropharynx non-erythematous. Cardiovascular: Normal rate, regular rhythm. Grossly normal heart sounds.  Good peripheral circulation. Respiratory: Normal respiratory effort.  No retractions. Lungs CTAB. Gastrointestinal: Soft and nontender. No distention. Positive bowel sounds Musculoskeletal: No lower extremity tenderness nor edema.   Neurologic:  Normal speech and language.   Skin:  Skin is warm, dry and intact. Psychiatric: Mood and affect are normal.   ____________________________________________   LABS (all labs ordered are listed, but only abnormal results are displayed)  Labs Reviewed - No data to display ____________________________________________  EKG  none ____________________________________________  RADIOLOGY  No results found.  ____________________________________________   PROCEDURES  Procedure(s) performed: None  Procedures  Critical Care performed: No  ____________________________________________   INITIAL IMPRESSION / ASSESSMENT AND PLAN / ED COURSE  As part of my medical decision making, I reviewed the following data within the electronic MEDICAL RECORD NUMBER Notes from prior ED visits and [place] Controlled Substance Database   This is a 66 year old female who comes into the hospital today with some right-sided facial swelling. 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 She does not have any shortness of breath or any tongue swelling I will give the patient a dose of Solu-Medrol, Benadryl, Pepcid and a liter of normal saline. I will monitor the patient and then reassess her.  My differential diagnosis includes medication reaction, angioedema, allergic reaction.     After a few hours the patient's facial swelling did start to improve. Her lower lip was no longer swollen and she felt improved. The patient will be discharged home to follow-up with her primary care physician as well as an allergist. ____________________________________________   FINAL CLINICAL IMPRESSION(S) / ED DIAGNOSES  Final diagnoses:  Angioedema, initial encounter      NEW MEDICATIONS STARTED DURING THIS VISIT:  New Prescriptions   DIPHENHYDRAMINE (BENADRYL) 25 MG CAPSULE    Take 1 capsule (25 mg total) by mouth every 4 (four) hours as needed.   FAMOTIDINE (PEPCID) 40 MG TABLET    Take 1 tablet (40 mg total) by mouth every evening.    PREDNISONE (DELTASONE) 20 MG TABLET    Take 3 tablets (60 mg total) by mouth daily.     Note:  This document was prepared using Dragon voice recognition software and may include unintentional dictation errors.    Loney Hering, MD 01/02/17 [phone]

## 2017-01-02 NOTE — Discharge Instructions (Signed)
Please follow up with your primary care physician and an allergist for further evaluation of your angioedema.

## 2017-01-02 NOTE — ED Notes (Signed)
Pt reports she woke up around 11 pm and noticed her mouth did not feel right. Got up and noticed swelling to the right upper lip and right side of her face. Pt also has swelling to the back half right side of her tongue.

## 2017-03-14 ENCOUNTER — Ambulatory Visit
Admission: RE | Admit: 2017-03-14 | Discharge: 2017-03-14 | Disposition: A | Discharge status: Home / Self Care | Payer: Medicare HMO | Source: Ambulatory Visit | Attending: Internal Medicine | Admitting: Internal Medicine

## 2017-03-14 DIAGNOSIS — Z1231 Encounter for screening mammogram for malignant neoplasm of breast: Secondary | ICD-10-CM | POA: Insufficient documentation

## 2017-12-12 ENCOUNTER — Other Ambulatory Visit: Payer: Self-pay | Admitting: Internal Medicine

## 2017-12-12 DIAGNOSIS — Z1231 Encounter for screening mammogram for malignant neoplasm of breast: Secondary | ICD-10-CM

## 2018-03-15 ENCOUNTER — Ambulatory Visit
Admission: RE | Admit: 2018-03-15 | Discharge: 2018-03-15 | Disposition: A | Discharge status: Home / Self Care | Payer: Medicare HMO | Source: Ambulatory Visit | Attending: Internal Medicine | Admitting: Internal Medicine

## 2018-03-15 DIAGNOSIS — Z1231 Encounter for screening mammogram for malignant neoplasm of breast: Secondary | ICD-10-CM | POA: Insufficient documentation

## 2019-03-06 ENCOUNTER — Other Ambulatory Visit: Payer: Self-pay | Admitting: Internal Medicine

## 2019-03-06 DIAGNOSIS — Z1231 Encounter for screening mammogram for malignant neoplasm of breast: Secondary | ICD-10-CM

## 2019-04-04 ENCOUNTER — Ambulatory Visit
Admission: RE | Admit: 2019-04-04 | Discharge: 2019-04-04 | Disposition: A | Discharge status: Home / Self Care | Payer: Medicare HMO | Source: Ambulatory Visit | Attending: Internal Medicine | Admitting: Internal Medicine

## 2019-04-04 DIAGNOSIS — Z1231 Encounter for screening mammogram for malignant neoplasm of breast: Secondary | ICD-10-CM | POA: Diagnosis not present

## 2019-04-16 ENCOUNTER — Ambulatory Visit: Payer: Medicare HMO | Attending: Internal Medicine

## 2019-04-16 ENCOUNTER — Other Ambulatory Visit: Payer: Self-pay

## 2019-04-16 DIAGNOSIS — Z23 Encounter for immunization: Secondary | ICD-10-CM | POA: Insufficient documentation

## 2019-04-16 NOTE — Progress Notes (Signed)
   Covid-19 Vaccination Clinic  Name:  Jodi Allison    MRN: [id number] DOB: 16-Sep-1950  04/16/2019  [name] was observed post Covid-19 immunization for 15 minutes without incidence. She was provided with Vaccine Information Sheet and instruction to access the V-Safe system.   [name] was instructed to call 911 with any severe reactions post vaccine: Marland Kitchen Difficulty breathing  . Swelling of your face and throat  . A fast heartbeat  . A bad rash all over your body  . Dizziness and weakness    Immunizations Administered    Name Date Dose VIS Date Route   Pfizer COVID-19 Vaccine 04/16/2019 10:45 AM 0.3 mL 02/16/2019 Intramuscular   Manufacturer: [employer]   Lot: [id number]   [place]: [id number]

## 2019-05-09 ENCOUNTER — Ambulatory Visit: Payer: Medicare HMO | Attending: Internal Medicine

## 2019-05-09 DIAGNOSIS — Z23 Encounter for immunization: Secondary | ICD-10-CM | POA: Insufficient documentation

## 2019-05-09 NOTE — Progress Notes (Signed)
   Covid-19 Vaccination Clinic  Name:  Jodi Allison    MRN: [id number] DOB: Feb 26, 1951  05/09/2019  [name] was observed post Covid-19 immunization for 15 minutes without incident. She was provided with Vaccine Information Sheet and instruction to access the V-Safe system.   [name] was instructed to call 911 with any severe reactions post vaccine: Marland Kitchen Difficulty breathing  . Swelling of face and throat  . A fast heartbeat  . A bad rash all over body  . Dizziness and weakness   Immunizations Administered    Name Date Dose VIS Date Route   Pfizer COVID-19 Vaccine 05/09/2019  9:54 AM 0.3 mL 02/16/2019 Intramuscular   Manufacturer: [place]   Lot: [id number]   [place]: [id number]

## 2019-05-10 ENCOUNTER — Ambulatory Visit: Payer: Medicare HMO

## 2019-09-13 ENCOUNTER — Other Ambulatory Visit
Admission: RE | Admit: 2019-09-13 | Discharge: 2019-09-13 | Disposition: A | Discharge status: Home / Self Care | Payer: Medicare HMO | Source: Ambulatory Visit | Attending: Gastroenterology | Admitting: Gastroenterology

## 2019-09-13 ENCOUNTER — Other Ambulatory Visit: Payer: Self-pay

## 2019-09-13 DIAGNOSIS — Z20822 Contact with and (suspected) exposure to covid-19: Secondary | ICD-10-CM | POA: Insufficient documentation

## 2019-09-13 DIAGNOSIS — Z01812 Encounter for preprocedural laboratory examination: Secondary | ICD-10-CM | POA: Diagnosis present

## 2019-09-13 LAB — SARS CORONAVIRUS 2 (TAT 6-24 HRS): SARS Coronavirus 2: NEGATIVE

## 2019-09-14 ENCOUNTER — Encounter: Payer: Self-pay | Admitting: Internal Medicine

## 2019-09-17 ENCOUNTER — Other Ambulatory Visit: Payer: Self-pay

## 2019-09-17 ENCOUNTER — Encounter: Payer: Self-pay | Admitting: Internal Medicine

## 2019-09-17 ENCOUNTER — Ambulatory Visit
Admission: RE | Admit: 2019-09-17 | Discharge: 2019-09-17 | Disposition: A | Discharge status: Home / Self Care | Payer: Medicare HMO | Attending: Internal Medicine | Admitting: Internal Medicine

## 2019-09-17 ENCOUNTER — Ambulatory Visit: Payer: Medicare HMO | Admitting: Certified Registered"

## 2019-09-17 ENCOUNTER — Encounter
Admission: RE | Disposition: A | Discharge status: Home / Self Care | Payer: Self-pay | Source: Home / Self Care | Attending: Internal Medicine

## 2019-09-17 DIAGNOSIS — Z882 Allergy status to sulfonamides status: Secondary | ICD-10-CM | POA: Insufficient documentation

## 2019-09-17 DIAGNOSIS — Z7952 Long term (current) use of systemic steroids: Secondary | ICD-10-CM | POA: Insufficient documentation

## 2019-09-17 DIAGNOSIS — Z85828 Personal history of other malignant neoplasm of skin: Secondary | ICD-10-CM | POA: Diagnosis not present

## 2019-09-17 DIAGNOSIS — M810 Age-related osteoporosis without current pathological fracture: Secondary | ICD-10-CM | POA: Insufficient documentation

## 2019-09-17 DIAGNOSIS — I10 Essential (primary) hypertension: Secondary | ICD-10-CM | POA: Diagnosis not present

## 2019-09-17 DIAGNOSIS — Z79899 Other long term (current) drug therapy: Secondary | ICD-10-CM | POA: Insufficient documentation

## 2019-09-17 DIAGNOSIS — Z880 Allergy status to penicillin: Secondary | ICD-10-CM | POA: Diagnosis not present

## 2019-09-17 DIAGNOSIS — K640 First degree hemorrhoids: Secondary | ICD-10-CM | POA: Insufficient documentation

## 2019-09-17 DIAGNOSIS — K5730 Diverticulosis of large intestine without perforation or abscess without bleeding: Secondary | ICD-10-CM | POA: Insufficient documentation

## 2019-09-17 DIAGNOSIS — Z86010 Personal history of colonic polyps: Secondary | ICD-10-CM | POA: Diagnosis not present

## 2019-09-17 DIAGNOSIS — Z888 Allergy status to other drugs, medicaments and biological substances status: Secondary | ICD-10-CM | POA: Insufficient documentation

## 2019-09-17 DIAGNOSIS — Z1211 Encounter for screening for malignant neoplasm of colon: Secondary | ICD-10-CM | POA: Diagnosis present

## 2019-09-17 HISTORY — DX: Supraventricular tachycardia: I47.1

## 2019-09-17 HISTORY — DX: Cardiac arrhythmia, unspecified: I49.9

## 2019-09-17 HISTORY — DX: Personal history of other infectious and parasitic diseases: Z86.19

## 2019-09-17 HISTORY — DX: Age-related osteoporosis without current pathological fracture: M81.0

## 2019-09-17 HISTORY — DX: Malignant (primary) neoplasm, unspecified: C80.1

## 2019-09-17 HISTORY — DX: Other fecal abnormalities: R19.5

## 2019-09-17 HISTORY — DX: Supraventricular tachycardia, unspecified: I47.10

## 2019-09-17 HISTORY — PX: COLONOSCOPY WITH PROPOFOL: SHX5780

## 2019-09-17 SURGERY — COLONOSCOPY WITH PROPOFOL
Anesthesia: General

## 2019-09-17 MED ORDER — CLINDAMYCIN PHOSPHATE 300 MG/2ML IJ SOLN
600.0000 mg | Freq: Once | INTRAMUSCULAR | Status: DC
  Filled 2019-09-17: qty 4

## 2019-09-17 MED ORDER — PROPOFOL 10 MG/ML IV BOLUS
INTRAVENOUS | Status: DC | PRN
  Administered 2019-09-17: 40 mg via INTRAVENOUS
  Administered 2019-09-17: 10 mg via INTRAVENOUS

## 2019-09-17 MED ORDER — CLINDAMYCIN PHOSPHATE 600 MG/50ML IV SOLN
INTRAVENOUS | Status: AC
  Administered 2019-09-17: 12:00:00 600 mg
  Filled 2019-09-17: qty 50

## 2019-09-17 MED ORDER — PROPOFOL 500 MG/50ML IV EMUL
INTRAVENOUS | Status: DC | PRN
  Administered 2019-09-17: 155 ug/kg/min via INTRAVENOUS

## 2019-09-17 MED ORDER — GLYCOPYRROLATE 0.2 MG/ML IJ SOLN
INTRAMUSCULAR | Status: DC | PRN
  Administered 2019-09-17: .2 mg via INTRAVENOUS

## 2019-09-17 MED ORDER — LIDOCAINE HCL (CARDIAC) PF 100 MG/5ML IV SOSY
PREFILLED_SYRINGE | INTRAVENOUS | Status: DC | PRN
  Administered 2019-09-17: 100 mg via INTRAVENOUS

## 2019-09-17 MED ORDER — SODIUM CHLORIDE 0.9 % IV SOLN: INTRAVENOUS | Status: DC

## 2019-09-17 NOTE — Interval H&P Note (Signed)
History and Physical Interval Note:  09/17/2019 11:31 AM  Jodi Allison  has presented today for surgery, with the diagnosis of PERS HX.OF COLON POLYPS.  The various methods of treatment have been discussed with the patient and family. After consideration of risks, benefits and other options for treatment, the patient has consented to  Procedure(s): COLONOSCOPY WITH PROPOFOL (N/A) as a surgical intervention.  The patient's history has been reviewed, patient examined, no change in status, stable for surgery.  I have reviewed the patient's chart and labs.  Questions were answered to the patient's satisfaction.     [place], [place]

## 2019-09-17 NOTE — Anesthesia Procedure Notes (Signed)
Procedure Name: General with mask airway Performed by: Fletcher-Harrison, Kamarri Lovvorn, CRNA Pre-anesthesia Checklist: Patient identified, Emergency Drugs available, Suction available and Patient being monitored Patient Re-evaluated:Patient Re-evaluated prior to induction Oxygen Delivery Method: Simple face mask Induction Type: IV induction Placement Confirmation: positive ETCO2 and CO2 detector Dental Injury: Teeth and Oropharynx as per pre-operative assessment        

## 2019-09-17 NOTE — Op Note (Signed)
[facility] Gastroenterology Patient Name: Jodi Allison Procedure Date: 09/17/2019 12:38 PM MRN: [id number] Account #: [id number] Date of Birth: Jan 08, 1951 Admit Type: Outpatient Age: 69 Room: [facility] ENDO ROOM 3 Gender: Female Note Status: Finalized Procedure:             Colonoscopy Indications:           Surveillance: Personal history of adenomatous polyps                         on last colonoscopy > 5 years ago Providers:             Lorie Apley K. Alice Reichert MD, MD Referring MD:          Ocie Cornfield. Ouida Sills MD, MD (Referring MD) Medicines:             Propofol per Anesthesia Complications:         No immediate complications. Procedure:             Pre-Anesthesia Assessment:                        - The risks and benefits of the procedure and the                         sedation options and risks were discussed with the                         patient. All questions were answered and informed                         consent was obtained.                        - Patient identification and proposed procedure were                         verified prior to the procedure by the nurse. The                         procedure was verified in the procedure room.                        - ASA Grade Assessment: III - A patient with severe                         systemic disease.                        - After reviewing the risks and benefits, the patient                         was deemed in satisfactory condition to undergo the                         procedure.                        After obtaining informed consent, the colonoscope was                         passed under  direct vision. Throughout the procedure,                         the patient's blood pressure, pulse, and oxygen                         saturations were monitored continuously. The                         Colonoscope was introduced through the anus and                         advanced to the the cecum,  identified by appendiceal                         orifice and ileocecal valve. The patient tolerated the                         procedure well. The quality of the bowel preparation                         was good. The ileocecal valve, appendiceal orifice,                         and rectum were photographed. The colonoscopy was                         somewhat difficult due to multiple diverticula in the                         colon and restricted mobility of the colon. Successful                         completion of the procedure was aided by withdrawing                         and reinserting the scope. Findings:      The perianal and digital rectal examinations were normal. Pertinent       negatives include normal sphincter tone and no palpable rectal lesions.      Multiple small and large-mouthed diverticula were found in the sigmoid       colon. There was no evidence of diverticular bleeding.      Non-bleeding internal hemorrhoids were found during retroflexion. The       hemorrhoids were Grade I (internal hemorrhoids that do not prolapse).      The exam was otherwise without abnormality. Impression:            - Severe diverticulosis in the sigmoid colon. There                         was no evidence of diverticular bleeding.                        - Non-bleeding internal hemorrhoids.                        - The examination was otherwise normal.                        -  No specimens collected. Recommendation:        - Patient has a contact number available for                         emergencies. The signs and symptoms of potential                         delayed complications were discussed with the patient.                         Return to normal activities tomorrow. Written                         discharge instructions were provided to the patient.                        - Resume previous diet.                        - Continue present medications.                         - Repeat colonoscopy in 5 years for surveillance.                        - Return to GI office PRN.                        - The findings and recommendations were discussed with                         the patient. Procedure Code(s):     --- Professional ---                        T0626, Colorectal cancer screening; colonoscopy on                         individual at high risk Diagnosis Code(s):     --- Professional ---                        K57.30, Diverticulosis of large intestine without                         perforation or abscess without bleeding                        K64.0, First degree hemorrhoids                        Z86.010, Personal history of colonic polyps CPT copyright 2019 [employer]. All rights reserved. The codes documented in this report are preliminary and upon coder review may  be revised to meet current compliance requirements. Efrain Sella MD, MD 09/17/2019 1:07:40 PM This report has been signed electronically. Number of Addenda: 0 Note Initiated On: 09/17/2019 12:38 PM Scope Withdrawal Time: 0 hours 10 minutes 43 seconds  Total Procedure Duration: 0 hours 19 minutes 59 seconds  Estimated Blood Loss:  Estimated blood loss: none.      [facility]

## 2019-09-17 NOTE — H&P (Signed)
Outpatient short stay form Pre-procedure 09/17/2019 10:57 AM Jodi Allison K. Alice Reichert, M.D.  Primary Physician: Frazier Richards, M.D.  Reason for visit:  Personal history of adenomatous colon polyps > 5 years ago.  History of present illness:                            Patient presents for colonoscopy for a personal hx of colon polyps. The patient denies abdominal pain, abnormal weight loss or rectal bleeding.     No current facility-administered medications for this encounter.  Current Outpatient Medications:  .  amLODipine (NORVASC) 5 MG tablet, Take 5 mg by mouth daily., Disp: , Rfl:  .  calcium carbonate (OSCAL) 1500 (600 Ca) MG TABS tablet, Take by mouth daily with breakfast., Disp: , Rfl:  .  escitalopram (LEXAPRO) 10 MG tablet, Take 10 mg by mouth daily., Disp: , Rfl:  .  hydrochlorothiazide (HYDRODIURIL) 12.5 MG tablet, Take 12.5 mg by mouth daily., Disp: , Rfl:  .  diphenhydrAMINE (BENADRYL) 25 mg capsule, Take 1 capsule (25 mg total) by mouth every 4 (four) hours as needed., Disp: 30 capsule, Rfl: 0 .  famotidine (PEPCID) 40 MG tablet, Take 1 tablet (40 mg total) by mouth every evening., Disp: 7 tablet, Rfl: 0 .  predniSONE (DELTASONE) 20 MG tablet, Take 3 tablets (60 mg total) by mouth daily., Disp: 12 tablet, Rfl: 0  No medications prior to admission.     Allergies  Allergen Reactions  . Lisinopril   . Losartan   . Penicillins   . Sulfa Antibiotics      Past Medical History:  Diagnosis Date  . Cancer (HCC)    BASAL CELL CARCINOMA  . Heme positive stool   . History of shingles   . Hypertension   . Osteoporosis   . SVT (supraventricular tachycardia) (HCC)    TREATED WITH AV NODE ABLATION    Review of systems:  Otherwise negative.    Physical Exam  Gen: Alert, oriented. Appears stated age.  HEENT: [place]/AT. PERRLA. Lungs: CTA, no wheezes. CV: RR nl S1, S2. Abd: soft, benign, no masses. BS+ Ext: No edema. Pulses 2+    Planned procedures: Proceed with  colonoscopy. The patient understands the nature of the planned procedure, indications, risks, alternatives and potential complications including but not limited to bleeding, infection, perforation, damage to internal organs and possible oversedation/side effects from anesthesia. The patient agrees and gives consent to proceed.  Please refer to procedure notes for findings, recommendations and patient disposition/instructions.     Jodi Allison K. Alice Reichert, M.D. Gastroenterology 09/17/2019  10:57 AM

## 2019-09-17 NOTE — Anesthesia Preprocedure Evaluation (Signed)
Anesthesia Evaluation  Patient identified by MRN, date of birth, ID band Patient awake    Reviewed: Allergy & Precautions, H&P , NPO status , Patient's Chart, lab work & pertinent test results, reviewed documented beta blocker date and time   Airway Mallampati: I  TM Distance: >3 FB Neck ROM: full    Dental  (+) Dental Advidsory Given, Caps, Teeth Intact   Pulmonary neg pulmonary ROS,    Pulmonary exam normal breath sounds clear to auscultation       Cardiovascular Exercise Tolerance: Good hypertension, (-) Past MI and (-) Cardiac Stents + dysrhythmias (s/p ablation) Supra Ventricular Tachycardia (-) Valvular Problems/Murmurs Rhythm:regular Rate:Normal     Neuro/Psych negative neurological ROS  negative psych ROS   GI/Hepatic negative GI ROS, Neg liver ROS,   Endo/Other  negative endocrine ROS  Renal/GU negative Renal ROS  negative genitourinary   Musculoskeletal   Abdominal   Peds  Hematology negative hematology ROS (+)   Anesthesia Other Findings Past Medical History: No date: Cancer ([place])     Comment:  BASAL CELL CARCINOMA No date: Dysrhythmia No date: Heme positive stool No date: History of shingles No date: Hypertension No date: Osteoporosis No date: SVT (supraventricular tachycardia) (HCC)     Comment:  TREATED WITH AV NODE ABLATION   Reproductive/Obstetrics negative OB ROS                             Anesthesia Physical Anesthesia Plan  ASA: II  Anesthesia Plan: General   Post-op Pain Management:    Induction: Intravenous  PONV Risk Score and Plan: Propofol infusion and TIVA  Airway Management Planned: Natural Airway and Nasal Cannula  Additional Equipment:   Intra-op Plan:   Post-operative Plan:   Informed Consent: I have reviewed the patients History and Physical, chart, labs and discussed the procedure including the risks, benefits and alternatives for the  proposed anesthesia with the patient or authorized representative who has indicated his/her understanding and acceptance.     Dental Advisory Given  Plan Discussed with: Anesthesiologist, CRNA and Surgeon  Anesthesia Plan Comments:         Anesthesia Quick Evaluation

## 2019-09-17 NOTE — Interval H&P Note (Signed)
History and Physical Interval Note:  09/17/2019 12:36 PM  Jodi Allison  has presented today for surgery, with the diagnosis of PERS HX.OF COLON POLYPS.  The various methods of treatment have been discussed with the patient and family. After consideration of risks, benefits and other options for treatment, the patient has consented to  Procedure(s): COLONOSCOPY WITH PROPOFOL (N/A) as a surgical intervention.  The patient's history has been reviewed, patient examined, no change in status, stable for surgery.  I have reviewed the patient's chart and labs.  Questions were answered to the patient's satisfaction.     [place], [place]t

## 2019-09-17 NOTE — Transfer of Care (Signed)
Immediate Anesthesia Transfer of Care Note  Patient: RAPHAELA CANNADAY  Procedure(s) Performed: COLONOSCOPY WITH PROPOFOL (N/A )  Patient Location: Endoscopy Unit  Anesthesia Type:General  Level of Consciousness: awake and patient cooperative  Airway & Oxygen Therapy: Patient Spontanous Breathing and Patient connected to face mask oxygen  Post-op Assessment: Report given to RN and Post -op Vital signs reviewed and stable  Post vital signs: Reviewed and stable  Last Vitals:  Vitals Value Taken Time  BP 101/67 09/17/19 1309  Temp    Pulse 94 09/17/19 1309  Resp 20 09/17/19 1309  SpO2 100 % 09/17/19 1309  Vitals shown include unvalidated device data.  Last Pain:  Vitals:   09/17/19 1308  TempSrc: (P) Temporal  PainSc:          Complications: No complications documented.

## 2019-09-18 ENCOUNTER — Encounter: Payer: Self-pay | Admitting: Internal Medicine

## 2019-09-18 NOTE — Anesthesia Postprocedure Evaluation (Signed)
Anesthesia Post Note  Patient: Jodi Allison  Procedure(s) Performed: COLONOSCOPY WITH PROPOFOL (N/A )  Patient location during evaluation: Endoscopy Anesthesia Type: General Level of consciousness: awake and alert Pain management: pain level controlled Vital Signs Assessment: post-procedure vital signs reviewed and stable Respiratory status: spontaneous breathing, nonlabored ventilation, respiratory function stable and patient connected to nasal cannula oxygen Cardiovascular status: blood pressure returned to baseline and stable Postop Assessment: no apparent nausea or vomiting Anesthetic complications: no   No complications documented.   Last Vitals:  Vitals:   09/17/19 1318 09/17/19 1328  BP: 117/71 132/78  Pulse: 100 92  Resp: (!) 24 17  Temp:    SpO2: 100% 100%    Last Pain:  Vitals:   09/17/19 1328  TempSrc:   PainSc: 0-No pain                 Martha Clan

## 2019-12-10 ENCOUNTER — Ambulatory Visit: Payer: Medicare HMO | Attending: Internal Medicine

## 2019-12-10 DIAGNOSIS — Z23 Encounter for immunization: Secondary | ICD-10-CM

## 2019-12-10 NOTE — Progress Notes (Signed)
   Covid-19 Vaccination Clinic  Name:  Jodi Allison    MRN: [id number] DOB: 05-12-1950  12/10/2019  Jodi Allison was observed post Covid-19 immunization for 15 minutes without incident. She was provided with Vaccine Information Sheet and instruction to access the V-Safe system.   Jodi Allison was instructed to call 911 with any severe reactions post vaccine: Marland Kitchen Difficulty breathing  . Swelling of face and throat  . A fast heartbeat  . A bad rash all over body  . Dizziness and weakness

## 2020-03-14 ENCOUNTER — Other Ambulatory Visit: Payer: Self-pay | Admitting: Internal Medicine

## 2020-03-14 DIAGNOSIS — Z1231 Encounter for screening mammogram for malignant neoplasm of breast: Secondary | ICD-10-CM

## 2020-04-08 ENCOUNTER — Other Ambulatory Visit: Payer: Self-pay

## 2020-04-08 ENCOUNTER — Ambulatory Visit
Admission: RE | Admit: 2020-04-08 | Discharge: 2020-04-08 | Disposition: A | Discharge status: Home / Self Care | Payer: Medicare HMO | Source: Ambulatory Visit | Attending: Internal Medicine | Admitting: Internal Medicine

## 2020-04-08 DIAGNOSIS — Z1231 Encounter for screening mammogram for malignant neoplasm of breast: Secondary | ICD-10-CM | POA: Diagnosis present

## 2021-03-10 ENCOUNTER — Other Ambulatory Visit: Payer: Self-pay | Admitting: Internal Medicine

## 2021-03-10 DIAGNOSIS — Z1231 Encounter for screening mammogram for malignant neoplasm of breast: Secondary | ICD-10-CM

## 2021-04-09 ENCOUNTER — Other Ambulatory Visit: Payer: Self-pay

## 2021-04-09 ENCOUNTER — Ambulatory Visit
Admission: RE | Admit: 2021-04-09 | Discharge: 2021-04-09 | Disposition: A | Discharge status: Home / Self Care | Payer: Medicare HMO | Source: Ambulatory Visit | Attending: Internal Medicine | Admitting: Internal Medicine

## 2021-04-09 DIAGNOSIS — Z1231 Encounter for screening mammogram for malignant neoplasm of breast: Secondary | ICD-10-CM | POA: Diagnosis present

## 2022-03-24 ENCOUNTER — Other Ambulatory Visit: Payer: Self-pay | Admitting: Internal Medicine

## 2022-03-24 DIAGNOSIS — Z1231 Encounter for screening mammogram for malignant neoplasm of breast: Secondary | ICD-10-CM

## 2022-04-13 ENCOUNTER — Ambulatory Visit
Admission: RE | Admit: 2022-04-13 | Discharge: 2022-04-13 | Disposition: A | Discharge status: Home / Self Care | Payer: Medicare HMO | Source: Ambulatory Visit | Attending: Internal Medicine | Admitting: Internal Medicine

## 2022-04-13 DIAGNOSIS — Z1231 Encounter for screening mammogram for malignant neoplasm of breast: Secondary | ICD-10-CM | POA: Insufficient documentation

## 2023-02-26 ENCOUNTER — Other Ambulatory Visit: Payer: Self-pay

## 2023-02-26 ENCOUNTER — Emergency Department: Payer: Medicare HMO

## 2023-02-26 ENCOUNTER — Observation Stay
Admission: EM | Admit: 2023-02-26 | Discharge: 2023-02-27 | Disposition: A | Discharge status: Home / Self Care | Payer: Medicare HMO | Attending: Internal Medicine | Admitting: Internal Medicine

## 2023-02-26 ENCOUNTER — Encounter: Payer: Self-pay | Admitting: Emergency Medicine

## 2023-02-26 DIAGNOSIS — Z79899 Other long term (current) drug therapy: Secondary | ICD-10-CM | POA: Diagnosis not present

## 2023-02-26 DIAGNOSIS — Z85828 Personal history of other malignant neoplasm of skin: Secondary | ICD-10-CM | POA: Diagnosis not present

## 2023-02-26 DIAGNOSIS — F419 Anxiety disorder, unspecified: Secondary | ICD-10-CM | POA: Diagnosis not present

## 2023-02-26 DIAGNOSIS — F32A Depression, unspecified: Secondary | ICD-10-CM | POA: Insufficient documentation

## 2023-02-26 DIAGNOSIS — D62 Acute posthemorrhagic anemia: Secondary | ICD-10-CM | POA: Insufficient documentation

## 2023-02-26 DIAGNOSIS — I10 Essential (primary) hypertension: Secondary | ICD-10-CM | POA: Diagnosis not present

## 2023-02-26 DIAGNOSIS — K922 Gastrointestinal hemorrhage, unspecified: Principal | ICD-10-CM

## 2023-02-26 DIAGNOSIS — K5792 Diverticulitis of intestine, part unspecified, without perforation or abscess without bleeding: Principal | ICD-10-CM

## 2023-02-26 DIAGNOSIS — Z96642 Presence of left artificial hip joint: Secondary | ICD-10-CM | POA: Diagnosis not present

## 2023-02-26 LAB — CBC
HCT: 36 % (ref 36.0–46.0)
Hemoglobin: 11.7 g/dL — ABNORMAL LOW (ref 12.0–15.0)
MCH: 26.8 pg (ref 26.0–34.0)
MCHC: 32.5 g/dL (ref 30.0–36.0)
MCV: 82.4 fL (ref 80.0–100.0)
Platelets: 306 10*3/uL (ref 150–400)
RBC: 4.37 MIL/uL (ref 3.87–5.11)
RDW: 14.6 % (ref 11.5–15.5)
WBC: 5.7 10*3/uL (ref 4.0–10.5)
nRBC: 0 % (ref 0.0–0.2)

## 2023-02-26 LAB — COMPREHENSIVE METABOLIC PANEL
ALT: 15 U/L (ref 0–44)
AST: 19 U/L (ref 15–41)
Albumin: 4 g/dL (ref 3.5–5.0)
Alkaline Phosphatase: 78 U/L (ref 38–126)
Anion gap: 9 (ref 5–15)
BUN: 26 mg/dL — ABNORMAL HIGH (ref 8–23)
CO2: 25 mmol/L (ref 22–32)
Calcium: 8.8 mg/dL — ABNORMAL LOW (ref 8.9–10.3)
Chloride: 105 mmol/L (ref 98–111)
Creatinine, Ser: 0.65 mg/dL (ref 0.44–1.00)
GFR, Estimated: >60 mL/min (ref >60)
Glucose, Bld: 96 mg/dL (ref 70–99)
Potassium: 3.6 mmol/L (ref 3.5–5.1)
Sodium: 139 mmol/L (ref 135–145)
Total Bilirubin: 0.3 mg/dL (ref <1.2)
Total Protein: 7.9 g/dL (ref 6.5–8.1)

## 2023-02-26 LAB — TYPE AND SCREEN
ABO/RH(D): O POS
Antibody Screen: NEGATIVE

## 2023-02-26 LAB — HEMOGLOBIN AND HEMATOCRIT, BLOOD
HCT: 33.8 % — ABNORMAL LOW (ref 36.0–46.0)
HCT: 34 % — ABNORMAL LOW (ref 36.0–46.0)
Hemoglobin: 10.7 g/dL — ABNORMAL LOW (ref 12.0–15.0)
Hemoglobin: 11 g/dL — ABNORMAL LOW (ref 12.0–15.0)

## 2023-02-26 LAB — URINALYSIS, ROUTINE W REFLEX MICROSCOPIC
Bilirubin Urine: NEGATIVE
Glucose, UA: NEGATIVE mg/dL
Hgb urine dipstick: NEGATIVE
Ketones, ur: NEGATIVE mg/dL
Leukocytes,Ua: NEGATIVE
Nitrite: NEGATIVE
Protein, ur: NEGATIVE mg/dL
Specific Gravity, Urine: 1.006 (ref 1.005–1.030)
pH: 5 (ref 5.0–8.0)

## 2023-02-26 LAB — CBC WITH DIFFERENTIAL/PLATELET
Abs Immature Granulocytes: 0.03 10*3/uL (ref 0.00–0.07)
Basophils Absolute: 0.1 10*3/uL (ref 0.0–0.1)
Basophils Relative: 1 %
Eosinophils Absolute: 0.4 10*3/uL (ref 0.0–0.5)
Eosinophils Relative: 4 %
HCT: 36.3 % (ref 36.0–46.0)
Hemoglobin: 11.8 g/dL — ABNORMAL LOW (ref 12.0–15.0)
Immature Granulocytes: 0 %
Lymphocytes Relative: 30 %
Lymphs Abs: 2.4 10*3/uL (ref 0.7–4.0)
MCH: 27.7 pg (ref 26.0–34.0)
MCHC: 32.5 g/dL (ref 30.0–36.0)
MCV: 85.2 fL (ref 80.0–100.0)
Monocytes Absolute: 0.7 10*3/uL (ref 0.1–1.0)
Monocytes Relative: 9 %
Neutro Abs: 4.5 10*3/uL (ref 1.7–7.7)
Neutrophils Relative %: 56 %
Platelets: 288 10*3/uL (ref 150–400)
RBC: 4.26 MIL/uL (ref 3.87–5.11)
RDW: 14.6 % (ref 11.5–15.5)
WBC: 8.2 10*3/uL (ref 4.0–10.5)
nRBC: 0 % (ref 0.0–0.2)

## 2023-02-26 LAB — LIPASE, BLOOD: Lipase: 44 U/L (ref 11–51)

## 2023-02-26 MED ORDER — SODIUM CHLORIDE 0.9 % IV SOLN
2.0000 g | Freq: Once | INTRAVENOUS | Status: AC
  Administered 2023-02-26: 2 g via INTRAVENOUS
  Filled 2023-02-26: qty 20

## 2023-02-26 MED ORDER — ACETAMINOPHEN 650 MG RE SUPP: 650.0000 mg | Freq: Four times a day (QID) | RECTAL | Status: DC | PRN

## 2023-02-26 MED ORDER — ESCITALOPRAM OXALATE 10 MG PO TABS
10.0000 mg | ORAL_TABLET | Freq: Every day | ORAL | Status: DC
  Administered 2023-02-26 – 2023-02-27 (×2): 10 mg via ORAL
  Filled 2023-02-26 (×2): qty 1

## 2023-02-26 MED ORDER — METRONIDAZOLE 500 MG/100ML IV SOLN
500.0000 mg | Freq: Once | INTRAVENOUS | Status: AC
Start: 2023-02-26 — End: 2023-02-26
  Administered 2023-02-26: 500 mg via INTRAVENOUS
  Filled 2023-02-26: qty 100

## 2023-02-26 MED ORDER — FAMOTIDINE 20 MG PO TABS
40.0000 mg | ORAL_TABLET | Freq: Every evening | ORAL | Status: DC
  Administered 2023-02-26: 40 mg via ORAL
  Filled 2023-02-26: qty 2

## 2023-02-26 MED ORDER — HYDROMORPHONE HCL 1 MG/ML IJ SOLN
0.5000 mg | INTRAMUSCULAR | Status: DC | PRN
Start: 2023-02-26 — End: 2023-02-27

## 2023-02-26 MED ORDER — RISAQUAD PO CAPS
1.0000 | ORAL_CAPSULE | Freq: Every day | ORAL | Status: DC
  Administered 2023-02-26 – 2023-02-27 (×2): 1 via ORAL
  Filled 2023-02-26 (×2): qty 1

## 2023-02-26 MED ORDER — HYDRALAZINE HCL 20 MG/ML IJ SOLN: 5.0000 mg | Freq: Four times a day (QID) | INTRAMUSCULAR | Status: DC | PRN

## 2023-02-26 MED ORDER — SODIUM CHLORIDE 0.9 % IV SOLN: INTRAVENOUS | Status: AC

## 2023-02-26 MED ORDER — SODIUM CHLORIDE 0.9 % IV SOLN
2.0000 g | INTRAVENOUS | Status: DC
  Administered 2023-02-27: 2 g via INTRAVENOUS
  Filled 2023-02-26: qty 20

## 2023-02-26 MED ORDER — BACID PO TABS
2.0000 | ORAL_TABLET | Freq: Three times a day (TID) | ORAL | Status: DC
  Filled 2023-02-26: qty 2

## 2023-02-26 MED ORDER — METRONIDAZOLE 500 MG/100ML IV SOLN
500.0000 mg | Freq: Two times a day (BID) | INTRAVENOUS | Status: DC
  Administered 2023-02-26 – 2023-02-27 (×2): 500 mg via INTRAVENOUS
  Filled 2023-02-26 (×2): qty 100

## 2023-02-26 MED ORDER — IOHEXOL 350 MG/ML SOLN
100.0000 mL | Freq: Once | INTRAVENOUS | Status: AC | PRN
  Administered 2023-02-26: 100 mL via INTRAVENOUS

## 2023-02-26 MED ORDER — ACETAMINOPHEN 325 MG PO TABS
650.0000 mg | ORAL_TABLET | Freq: Four times a day (QID) | ORAL | Status: DC | PRN
  Administered 2023-02-26 (×2): 650 mg via ORAL
  Filled 2023-02-26 (×2): qty 2

## 2023-02-26 MED ORDER — ONDANSETRON HCL 4 MG/2ML IJ SOLN: 4.0000 mg | Freq: Four times a day (QID) | INTRAMUSCULAR | Status: DC | PRN

## 2023-02-26 MED ORDER — DIPHENHYDRAMINE HCL 25 MG PO CAPS: 25.0000 mg | ORAL_CAPSULE | ORAL | Status: DC | PRN

## 2023-02-26 MED ORDER — PANTOPRAZOLE SODIUM 40 MG PO TBEC
40.0000 mg | DELAYED_RELEASE_TABLET | Freq: Every day | ORAL | Status: DC
  Administered 2023-02-26 – 2023-02-27 (×2): 40 mg via ORAL
  Filled 2023-02-26 (×2): qty 1

## 2023-02-26 MED ORDER — ONDANSETRON HCL 4 MG PO TABS: 4.0000 mg | ORAL_TABLET | Freq: Four times a day (QID) | ORAL | Status: DC | PRN

## 2023-02-26 NOTE — ED Provider Notes (Signed)
[facility] Provider Note    Event Date/Time   First MD Initiated Contact with Patient 02/26/23 [phone]     (approximate)   History   Rectal Bleeding   HPI  Jodi Allison is a 72 y.o. female  with h/o diverticulosis, family h/o colon CA, here with rectal bleeding, abdominal pain. Pt reports 24 hours of progressively worsening bloody stools. She initially had a slight increase in her fairly chronic lower abdominal pain over the last 24 hours.  She then began having initially small amount of blood and a soft bowel movement.  She then had 2 additional episodes of increasingly bloody and no grossly bloody bowel movements.  She has a history of diverticulitis but has never had bleeding.  She is not on anticoagulation.  Denies any lightheadedness or dizziness.  No fevers.  No recent sick contacts.  Recent medication changes.      Physical Exam   Triage Vital Signs: ED Triage Vitals  Encounter Vitals Group     BP 02/26/23 0108 (!) 189/79     Systolic BP Percentile --      Diastolic BP Percentile --      Pulse Rate 02/26/23 0108 86     Resp 02/26/23 0108 18     Temp 02/26/23 0108 98.4 F (36.9 C)     Temp src --      SpO2 02/26/23 0108 100 %     Weight 02/26/23 0108 140 lb (63.5 kg)     Height 02/26/23 0108 5\' 4"  (1.626 m)     Head Circumference --      Peak Flow --      Pain Score 02/26/23 0106 3     Pain Loc --      Pain Education --      Exclude from Growth Chart --     Most recent vital signs: Vitals:   02/26/23 0600 02/26/23 0630  BP: (!) 157/69 (!) 155/65  Pulse: 85 83  Resp: 16 16  Temp:    SpO2: 100% 98%     General: Awake, no distress.  CV:  Good peripheral perfusion.  Resp:  Normal work of breathing.  Abd:  No distention.  Moderate left lower and suprapubic abdominal tenderness without rebound or guarding. Other:  Overall well-appearing in no distress.   ED Results / Procedures / Treatments   Labs (all labs ordered are listed,  but only abnormal results are displayed) Labs Reviewed  CBC WITH DIFFERENTIAL/PLATELET - Abnormal; Notable for the following components:      Result Value   Hemoglobin 11.8 (*)    All other components within normal limits  COMPREHENSIVE METABOLIC PANEL - Abnormal; Notable for the following components:   BUN 26 (*)    Calcium 8.8 (*)    All other components within normal limits  URINALYSIS, ROUTINE W REFLEX MICROSCOPIC - Abnormal; Notable for the following components:   Color, Urine STRAW (*)    APPearance CLEAR (*)    All other components within normal limits  URINE CULTURE  LIPASE, BLOOD  CBC  TYPE AND SCREEN     EKG    RADIOLOGY CT angio GI: Sigmoid diverticulitis without active bleeding   I also independently reviewed and agree with radiologist interpretations.   PROCEDURES:  Critical Care performed: No   MEDICATIONS ORDERED IN ED: Medications  cefTRIAXone (ROCEPHIN) 2 g in sodium chloride 0.9 % 100 mL IVPB (has no administration in time range)  metroNIDAZOLE (FLAGYL) IVPB 500  mg (has no administration in time range)  iohexol (OMNIPAQUE) 350 MG/ML injection 100 mL (100 mLs Intravenous Contrast Given 02/26/23 0639)     IMPRESSION / MDM / ASSESSMENT AND PLAN / ED COURSE  I reviewed the triage vital signs and the nursing notes.                              Differential diagnosis includes, but is not limited to, acute diverticulosis, diverticulitis, possible arterial bleed, mesenteric ischemia, hemorrhoids  Patient's presentation is most consistent with acute presentation with potential threat to life or bodily function.  The patient is on the cardiac monitor to evaluate for evidence of arrhythmia and/or significant heart rate changes   72 year old female with history of diverticulosis here with lower abdominal discomfort and grossly bloody bowel movements.  CBC shows mild anemia of 11.8, no known prior baselines.  CMP with mild dehydration with elevated BUN to  creatinine ratio.  UA negative.  CT scan obtained, reviewed, shows no evidence of active bleed but does show fairly significant acute sigmoid diverticulitis.  No abscess or perforation.  Given patient's degree of diverticulitis with increasingly bloody bowel movements, will plan to admit for observation.  She is not on anticoagulation but does have a fairly significant amount of bleeding ongoing.   FINAL CLINICAL IMPRESSION(S) / ED DIAGNOSES   Final diagnoses:  Acute diverticulitis  Lower GI bleed     Rx / DC Orders   ED Discharge Orders     None        Note:  This document was prepared using Dragon voice recognition software and may include unintentional dictation errors.   Shaune Pollack, MD 02/26/23 0730

## 2023-02-26 NOTE — ED Triage Notes (Signed)
Pt presents to the ED via POV with complaints of rectal bleeding x 1 day. Hx of diverticulitis without bleeding. Pt states when she goes to the restroom she has some bright red stools. Mild discomfort in her lower abdomen. A&Ox4 at this time. Denies CP or SOB.

## 2023-02-26 NOTE — H&P (Signed)
History and Physical    Jodi Allison ZOX:[id number] DOB: 03/25/1950 DOA: 02/26/2023  PCP: Lauro Regulus, MD (Confirm with patient/family/NH records and if not entered, this has to be entered at [facility] point of entry) Patient coming from: Home   I have personally briefly reviewed patient's old medical records in [facility]  Chief Complaint: Rectal bleed  HPI: Jodi Allison is a 72 y.o. female with medical history significant of HTN, paroxysmal SVT, diverticulosis, anxiety/depression, presented with new onset of lower GI bleed.  Symptoms started this morning, patient suddenly developed mild left lower quadrant abdominal pain and passed large amount of bright red blood, no tenesmus.  The patient went to her work and she passed another 2 episode of similar amount of bright red blood and continue to feel left lower quadrant cramping-like pain.  Denied any nauseous vomiting no fever or chills.  Then patient came to ED last night and then passed a smaller amount of red blood per rectum and this morning she felt abdominal pain is improved.  Review of Systems: As per HPI otherwise 14 point review of systems negative.    Past Medical History:  Diagnosis Date   Cancer (HCC)    BASAL CELL CARCINOMA   Dysrhythmia    Heme positive stool    History of shingles    Hypertension    Osteoporosis    SVT (supraventricular tachycardia) (HCC)    TREATED WITH AV NODE ABLATION    Past Surgical History:  Procedure Laterality Date   ANKLE FRACTURE SURGERY     CARDIAC ELECTROPHYSIOLOGY MAPPING AND ABLATION  2000   COLONOSCOPY     COLONOSCOPY     COLONOSCOPY WITH PROPOFOL N/A 09/17/2019   Procedure: COLONOSCOPY WITH PROPOFOL;  Surgeon: Toledo, Boykin Nearing, MD;  Location: [facility] ENDOSCOPY;  Service: Endoscopy;  Laterality: N/A;   ESOPHAGOGASTRODUODENOSCOPY     FRACTURE SURGERY     JOINT REPLACEMENT Left    TOTAL HIP ARTHROPLASTY       reports that she has never smoked. She has never used  smokeless tobacco. She reports current alcohol use of about 4.0 standard drinks of alcohol per week. She reports that she does not use drugs.  Allergies  Allergen Reactions   Lisinopril    Losartan    Penicillins    Sulfa Antibiotics     Family History  Problem Relation Age of Onset   Breast cancer Mother 26     Prior to Admission medications   Medication Sig Start Date End Date Taking? Authorizing Provider  amLODipine (NORVASC) 5 MG tablet Take 5 mg by mouth daily.    [provider]  calcium carbonate (OSCAL) 1500 (600 Ca) MG TABS tablet Take by mouth daily with breakfast.    [provider]  diphenhydrAMINE (BENADRYL) 25 mg capsule Take 1 capsule (25 mg total) by mouth every 4 (four) hours as needed. 01/02/17 01/02/18  Rebecka Apley, MD  escitalopram (LEXAPRO) 10 MG tablet Take 10 mg by mouth daily.    [provider]  famotidine (PEPCID) 40 MG tablet Take 1 tablet (40 mg total) by mouth every evening. 01/02/17 01/02/18  Rebecka Apley, MD  hydrochlorothiazide (HYDRODIURIL) 12.5 MG tablet Take 12.5 mg by mouth daily.    [provider]  predniSONE (DELTASONE) 20 MG tablet Take 3 tablets (60 mg total) by mouth daily. 01/02/17   Rebecka Apley, MD    Physical Exam: Vitals:   02/26/23 0108 02/26/23 0600 02/26/23 0630 02/26/23  0700  BP: (!) 189/79 (!) 157/69 (!) 155/65 (!) 155/73  Pulse: 86 85 83 83  Resp: 18 16 16 18   Temp: 98.4 F (36.9 C)     SpO2: 100% 100% 98% 99%  Weight: 63.5 kg     Height: 5\' 4"  (1.626 m)       Constitutional: NAD, calm, comfortable Vitals:   02/26/23 0108 02/26/23 0600 02/26/23 0630 02/26/23 0700  BP: (!) 189/79 (!) 157/69 (!) 155/65 (!) 155/73  Pulse: 86 85 83 83  Resp: 18 16 16 18   Temp: 98.4 F (36.9 C)     SpO2: 100% 100% 98% 99%  Weight: 63.5 kg     Height: 5\' 4"  (1.626 m)      Eyes: PERRL, lids and conjunctivae normal ENMT: Mucous membranes are moist. Posterior pharynx clear of any  exudate or lesions.Normal dentition.  Neck: normal, supple, no masses, no thyromegaly Respiratory: clear to auscultation bilaterally, no wheezing, no crackles. Normal respiratory effort. No accessory muscle use.  Cardiovascular: Regular rate and rhythm, no murmurs / rubs / gallops. No extremity edema. 2+ pedal pulses. No carotid bruits.  Abdomen: Mild tenderness on left lower quadrant, no rebound no guarding, no masses palpated. No hepatosplenomegaly. Bowel sounds positive.  Musculoskeletal: no clubbing / cyanosis. No joint deformity upper and lower extremities. Good ROM, no contractures. Normal muscle tone.  Skin: no rashes, lesions, ulcers. No induration Neurologic: CN 2-12 grossly intact. Sensation intact, DTR normal. Strength 5/5 in all 4.  Psychiatric: Normal judgment and insight. Alert and oriented x 3. Normal mood.     Labs on Admission: I have personally reviewed following labs and imaging studies  CBC: Recent Labs  Lab 02/26/23 0110  WBC 8.2  NEUTROABS 4.5  HGB 11.8*  HCT 36.3  MCV 85.2  PLT 288   Basic Metabolic Panel: Recent Labs  Lab 02/26/23 0110  NA 139  K 3.6  CL 105  CO2 25  GLUCOSE 96  BUN 26*  CREATININE 0.65  CALCIUM 8.8*   GFR: Estimated Creatinine Clearance: 55.7 mL/min (by C-G formula based on SCr of 0.65 mg/dL). Liver Function Tests: Recent Labs  Lab 02/26/23 0110  AST 19  ALT 15  ALKPHOS 78  BILITOT 0.3  PROT 7.9  ALBUMIN 4.0   Recent Labs  Lab 02/26/23 0110  LIPASE 44   No results for input(s): "AMMONIA" in the last 168 hours. Coagulation Profile: No results for input(s): "INR", "PROTIME" in the last 168 hours. Cardiac Enzymes: No results for input(s): "CKTOTAL", "CKMB", "CKMBINDEX", "TROPONINI" in the last 168 hours. BNP (last 3 results) No results for input(s): "PROBNP" in the last 8760 hours. HbA1C: No results for input(s): "HGBA1C" in the last 72 hours. CBG: No results for input(s): "GLUCAP" in the last 168 hours. Lipid  Profile: No results for input(s): "CHOL", "HDL", "LDLCALC", "TRIG", "CHOLHDL", "LDLDIRECT" in the last 72 hours. Thyroid Function Tests: No results for input(s): "TSH", "T4TOTAL", "FREET4", "T3FREE", "THYROIDAB" in the last 72 hours. Anemia Panel: No results for input(s): "VITAMINB12", "FOLATE", "FERRITIN", "TIBC", "IRON", "RETICCTPCT" in the last 72 hours. Urine analysis:    Component Value Date/Time   COLORURINE STRAW (A) 02/26/2023 0111   APPEARANCEUR CLEAR (A) 02/26/2023 0111   APPEARANCEUR Clear 03/07/2013 1023   LABSPEC 1.006 02/26/2023 0111   LABSPEC 1.017 03/07/2013 1023   PHURINE 5.0 02/26/2023 0111   GLUCOSEU NEGATIVE 02/26/2023 0111   GLUCOSEU Negative 03/07/2013 1023   HGBUR NEGATIVE 02/26/2023 0111   BILIRUBINUR NEGATIVE 02/26/2023 0111  BILIRUBINUR Negative 03/07/2013 1023   KETONESUR NEGATIVE 02/26/2023 0111   PROTEINUR NEGATIVE 02/26/2023 0111   NITRITE NEGATIVE 02/26/2023 0111   LEUKOCYTESUR NEGATIVE 02/26/2023 0111   LEUKOCYTESUR Negative 03/07/2013 1023    Radiological Exams on Admission: CT ANGIO GI BLEED Result Date: 02/26/2023 CLINICAL DATA:  Lower GI bleeding with increase. Abdominal pain with diverticulitis. EXAM: CTA ABDOMEN AND PELVIS WITHOUT AND WITH CONTRAST TECHNIQUE: Multidetector CT imaging of the abdomen and pelvis was performed using the standard protocol during bolus administration of intravenous contrast. Multiplanar reconstructed images and MIPs were obtained and reviewed to evaluate the vascular anatomy. RADIATION DOSE REDUCTION: This exam was performed according to the departmental dose-optimization program which includes automated exposure control, adjustment of the mA and/or kV according to patient size and/or use of iterative reconstruction technique. CONTRAST:  OMNIPAQUE IOHEXOL 350 MG/ML SOLN COMPARISON:  None Available. FINDINGS: VASCULAR Aorta: Atheromatous wall thickening and calcification affecting the aorta, overall mild. Celiac:  Separate origin of the celiac branches. There is some tortuosity of the left gastric artery but no convincing beading. No dissection or proximal stenosis. SMA: Vessels are smoothly contoured and diffusely patent. No evidence of aneurysm or vascular malformation. No proximal stenosis Renals: Vessels are smoothly contoured and diffusely patent. Negative for aneurysm or significant stenosis IMA: Patent Inflow: Unremarkable Proximal Outflow: Unremarkable Veins: Unremarkable Review of the MIP images confirms the above findings. NON-VASCULAR Lower chest: Bands of atelectasis or scarring in the lower lobes. Hepatobiliary: Innumerable hepatic cysts, some with peripheral calcification and septation, including in the high right lobe, but no solid mass or focal complexity. Negative gallbladder and bile ducts. Pancreas: Unremarkable Spleen: Unremarkable Adrenals/Urinary Tract: Innumerable renal cysts, some showing some elevated density compared to water, including exophytic from the posterior right kidney on 16:81 at 3.2 cm, but no solid or enhancing lesion is seen when pre and postcontrast images are compared. No hydronephrosis or ureteral calculus. Unremarkable bladder. Stomach/Bowel: Innumerable sigmoid diverticula with diffusely thickened wall. There is superimposed fat inflammation and prominent Vasa recta and mesenteric node thickening locally. Dissecting gas collection wrapping inferior and lateral from the inferior sigmoid colon but no enhancing rind, likely large chronic diverticulum or sequela of prior diverticulitis. No drainable collection. Lymphatic: Enlarged pericolonic lymph nodes at the level of active diverticulitis. No generalized adenopathy. Reproductive: Small calcified fundal fibroid. Other: No ascites or pneumoperitoneum Musculoskeletal: Left hip replacement. Generalized lumbar spine degeneration with dextroscoliosis. IMPRESSION: 1. Sigmoid diverticulitis.  No detected active GI bleeding. 2. Polycystic  disease of the kidneys and liver 3. Mild atherosclerosis. Electronically Signed   By: Tiburcio Pea M.D.   On: 02/26/2023 07:04    EKG: Ordered  Assessment/Plan Principal Problem:   Lower GI bleed Active Problems:   Diverticulitis  (please populate well all problems here in Problem List. (For example, if patient is on BP meds at home and you resume or decide to hold them, it is a problem that needs to be her. Same for CAD, COPD, HLD and so on)  Acute blood loss anemia Acute hematochezia -Secondary to acute diverticulitis.  CTA abdomen pelvis showed no active bleeding and patient does report gradually decreased amount of blood per rectum -Continue to trend H&H this afternoon and evening, transfuse for hemodynamic instability -Continue antibiotics ceftriaxone and Flagyl -Probiotics -N.p.o. today until H&H stabilized and initiate maintenance IV fluid -GI prophylaxis with PPI -Colonoscopy in 6 weeks, patient reports that she will follow-up with [facility].  Elevated BP without diagnosis of HTN -As needed hydralazine for  now  Anxiety/depression -Stable, continue Lexapro  DVT prophylaxis: SCD Code Status: Full code Family Communication: None at bedside Disposition Plan: Patient is sick with lower GI bleed Consults called: None Admission status: Telemetry admission   Emeline General MD [facility] Pager [phone]  02/26/2023, 8:25 AM

## 2023-02-26 NOTE — ED Notes (Signed)
CCMD notified of cardiac monitoring order.

## 2023-02-26 NOTE — ED Notes (Signed)
PT to CT.

## 2023-02-26 NOTE — ED Notes (Signed)
CCMD called to transfer pt to RM 36

## 2023-02-26 NOTE — ED Notes (Signed)
Pt ambulated to the restroom with a steady gait.

## 2023-02-27 DIAGNOSIS — D62 Acute posthemorrhagic anemia: Secondary | ICD-10-CM | POA: Diagnosis present

## 2023-02-27 DIAGNOSIS — K922 Gastrointestinal hemorrhage, unspecified: Secondary | ICD-10-CM | POA: Diagnosis not present

## 2023-02-27 DIAGNOSIS — I10 Essential (primary) hypertension: Secondary | ICD-10-CM | POA: Diagnosis present

## 2023-02-27 DIAGNOSIS — F32A Depression, unspecified: Secondary | ICD-10-CM | POA: Diagnosis present

## 2023-02-27 LAB — CBC
HCT: 32.4 % — ABNORMAL LOW (ref 36.0–46.0)
Hemoglobin: 10.5 g/dL — ABNORMAL LOW (ref 12.0–15.0)
MCH: 27.4 pg (ref 26.0–34.0)
MCHC: 32.4 g/dL (ref 30.0–36.0)
MCV: 84.6 fL (ref 80.0–100.0)
Platelets: 273 10*3/uL (ref 150–400)
RBC: 3.83 MIL/uL — ABNORMAL LOW (ref 3.87–5.11)
RDW: 14.6 % (ref 11.5–15.5)
WBC: 5.8 10*3/uL (ref 4.0–10.5)
nRBC: 0 % (ref 0.0–0.2)

## 2023-02-27 LAB — URINE CULTURE: Culture: NO GROWTH

## 2023-02-27 MED ORDER — CIPROFLOXACIN HCL 500 MG PO TABS: 500.0000 mg | ORAL_TABLET | Freq: Two times a day (BID) | ORAL | 0 refills | Status: AC

## 2023-02-27 MED ORDER — METRONIDAZOLE 500 MG PO TABS: 500.0000 mg | ORAL_TABLET | Freq: Three times a day (TID) | ORAL | 0 refills | Status: AC

## 2023-02-27 NOTE — Consult Note (Signed)
Inpatient Consultation   Patient ID: Jodi Allison is a 72 y.o. female.  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 Found to have acute sigmoid diveritculitis on imaging. GI consulted for BRBPR.  Pt had 1 episode of BRBPR on Friday evening. This occurred again on Saturday prompting her to come to the ED. Since presentation, amount of bleeding has diminished to stopped altogether. She had a brown BM this morning. She has not had much in the way of abdominal pain, n/v/fever/chills through the course.  Currently resting in bed comfortably and hoping to go home. Tolerating clear liquid diet.  Denies other gi sx  No tobacco use Denies NSAIDs, Anti-plt agents, and anticoagulants + fhx crc- PGM (68 y/o); father colon polyps Denies other family history of gastrointestinal disease and malignancy Previous Endoscopies: 09/17/19- Colonoscopy - sigmoid diverticulosis, otherwise unremarkable  Remote egd   Past Medical History:  Diagnosis Date   Cancer (HCC)    BASAL CELL CARCINOMA   Dysrhythmia    Heme positive stool    History of shingles    Hypertension    Osteoporosis    SVT (supraventricular tachycardia) (HCC)    TREATED WITH AV NODE ABLATION    Past Surgical History:  Procedure Laterality Date   ANKLE FRACTURE SURGERY     CARDIAC ELECTROPHYSIOLOGY MAPPING AND ABLATION  2000   COLONOSCOPY     COLONOSCOPY     COLONOSCOPY WITH PROPOFOL N/A 09/17/2019   Procedure: COLONOSCOPY WITH PROPOFOL;  Surgeon: Toledo, Boykin Nearing, MD;  Location: [facility] ENDOSCOPY;  Service: Endoscopy;  Laterality: N/A;   ESOPHAGOGASTRODUODENOSCOPY     FRACTURE SURGERY     JOINT REPLACEMENT Left    TOTAL HIP ARTHROPLASTY      Allergies  Allergen Reactions    Lisinopril    Losartan    Penicillins    Sulfa Antibiotics     Family History  Problem Relation Age of Onset   Breast cancer Mother 105    Social History   Tobacco Use   Smoking status: Never   Smokeless tobacco: Never  Vaping Use   Vaping status: Never Used  Substance Use Topics   Alcohol use: Yes    Alcohol/week: 4.0 standard drinks of alcohol    Types: 2 Glasses of wine, 2 Standard drinks or equivalent per week    Comment: none last 24 hrs   Drug use: Never     Pertinent GI related history and allergies were reviewed with the patient  Review of Systems  Constitutional:  Negative for activity change, appetite change, chills, diaphoresis, fatigue, fever and unexpected weight change.  HENT:  Negative for trouble swallowing and voice change.   Respiratory:  Negative for shortness of breath and wheezing.   Cardiovascular:  Negative for chest pain, palpitations and leg swelling.  Gastrointestinal:  Positive for abdominal pain, blood in stool and diarrhea. Negative for abdominal distention, anal bleeding, constipation, nausea, rectal pain and vomiting.  Musculoskeletal:  Negative for arthralgias and myalgias.  Skin:  Negative for color change and pallor.  Neurological:  Negative for dizziness, syncope and weakness.  Psychiatric/Behavioral:  Negative for confusion.   All other systems reviewed and are negative.    Medications Home Medications No current facility-administered medications on file  prior to encounter.   Current Outpatient Medications on File Prior to Encounter  Medication Sig Dispense Refill   albuterol (VENTOLIN HFA) 108 (90 Base) MCG/ACT inhaler Inhale 2 puffs into the lungs every 4 (four) hours as needed for wheezing.     amLODipine (NORVASC) 5 MG tablet Take 5 mg by mouth daily.     calcium carbonate (OSCAL) 1500 (600 Ca) MG TABS tablet Take by mouth daily with breakfast.     diphenhydrAMINE (BENADRYL) 25 mg capsule Take 1 capsule (25 mg total) by mouth  every 4 (four) hours as needed. 30 capsule 0   escitalopram (LEXAPRO) 10 MG tablet Take 10 mg by mouth daily.     pravastatin (PRAVACHOL) 20 MG tablet Take 1 tablet by mouth at bedtime.     famotidine (PEPCID) 40 MG tablet Take 1 tablet (40 mg total) by mouth every evening. (Patient not taking: Reported on 02/26/2023) 7 tablet 0   hydrochlorothiazide (HYDRODIURIL) 12.5 MG tablet Take 12.5 mg by mouth daily. (Patient not taking: Reported on 02/26/2023)     predniSONE (DELTASONE) 20 MG tablet Take 3 tablets (60 mg total) by mouth daily. (Patient not taking: Reported on 02/26/2023) 12 tablet 0   Pertinent GI related medications were reviewed with the patient  Inpatient Medications  Current Facility-Administered Medications:    acetaminophen (TYLENOL) tablet 650 mg, 650 mg, Oral, Q6H PRN, 650 mg at 02/26/23 2306 **OR** acetaminophen (TYLENOL) suppository 650 mg, 650 mg, Rectal, Q6H PRN, Emeline General, MD   acidophilus (RISAQUAD) capsule 1 capsule, 1 capsule, Oral, Daily, Effie Shy, RPH, 1 capsule at 02/27/23 0902   cefTRIAXone (ROCEPHIN) 2 g in sodium chloride 0.9 % 100 mL IVPB, 2 g, Intravenous, Q24H, Mikey College T, MD, Last Rate: 200 mL/hr at 02/27/23 0901, 2 g at 02/27/23 0901   diphenhydrAMINE (BENADRYL) capsule 25 mg, 25 mg, Oral, Q4H PRN, Mikey College T, MD   escitalopram (LEXAPRO) tablet 10 mg, 10 mg, Oral, Daily, Mikey College T, MD, 10 mg at 02/27/23 0901   famotidine (PEPCID) tablet 40 mg, 40 mg, Oral, QPM, Mikey College T, MD, 40 mg at 02/26/23 1731   hydrALAZINE (APRESOLINE) injection 5 mg, 5 mg, Intravenous, Q6H PRN, Mikey College T, MD   HYDROmorphone (DILAUDID) injection 0.5-1 mg, 0.5-1 mg, Intravenous, Q2H PRN, Mikey College T, MD   metroNIDAZOLE (FLAGYL) IVPB 500 mg, 500 mg, Intravenous, Q12H, Mikey College T, MD, Last Rate: 100 mL/hr at 02/27/23 0859, 500 mg at 02/27/23 0859   ondansetron (ZOFRAN) tablet 4 mg, 4 mg, Oral, Q6H PRN **OR** ondansetron (ZOFRAN) injection 4 mg, 4 mg,  Intravenous, Q6H PRN, Mikey College T, MD   pantoprazole (PROTONIX) EC tablet 40 mg, 40 mg, Oral, Daily, Mikey College T, MD, 40 mg at 02/27/23 3664  Current Outpatient Medications:    albuterol (VENTOLIN HFA) 108 (90 Base) MCG/ACT inhaler, Inhale 2 puffs into the lungs every 4 (four) hours as needed for wheezing., Disp: , Rfl:    amLODipine (NORVASC) 5 MG tablet, Take 5 mg by mouth daily., Disp: , Rfl:    calcium carbonate (OSCAL) 1500 (600 Ca) MG TABS tablet, Take by mouth daily with breakfast., Disp: , Rfl:    diphenhydrAMINE (BENADRYL) 25 mg capsule, Take 1 capsule (25 mg total) by mouth every 4 (four) hours as needed., Disp: 30 capsule, Rfl: 0   escitalopram (LEXAPRO) 10 MG tablet, Take 10 mg by mouth daily., Disp: , Rfl:    pravastatin (PRAVACHOL) 20 MG tablet, Take 1 tablet by  mouth at bedtime., Disp: , Rfl:    famotidine (PEPCID) 40 MG tablet, Take 1 tablet (40 mg total) by mouth every evening. (Patient not taking: Reported on 02/26/2023), Disp: 7 tablet, Rfl: 0   hydrochlorothiazide (HYDRODIURIL) 12.5 MG tablet, Take 12.5 mg by mouth daily. (Patient not taking: Reported on 02/26/2023), Disp: , Rfl:    predniSONE (DELTASONE) 20 MG tablet, Take 3 tablets (60 mg total) by mouth daily. (Patient not taking: Reported on 02/26/2023), Disp: 12 tablet, Rfl: 0  cefTRIAXone (ROCEPHIN)  IV 2 g (02/27/23 0901)   metronidazole 500 mg (02/27/23 0859)    acetaminophen **OR** acetaminophen, diphenhydrAMINE, hydrALAZINE, HYDROmorphone (DILAUDID) injection, ondansetron **OR** ondansetron (ZOFRAN) IV   Objective   Vitals:   02/27/23 0500 02/27/23 0600 02/27/23 0852 02/27/23 0942  BP: (!) 128/59 (!) 156/60 139/71   Pulse: 65 64 70   Resp: 20 20 20    Temp:    98 F (36.7 C)  TempSrc:    Oral  SpO2: 95% 98% 99%   Weight:      Height:         Physical Exam Vitals and nursing note reviewed.  Constitutional:      General: She is not in acute distress.    Appearance: Normal appearance. She is not  ill-appearing, toxic-appearing or diaphoretic.  HENT:     Head: Normocephalic and atraumatic.     Nose: Nose normal.     Mouth/Throat:     Mouth: Mucous membranes are moist.     Pharynx: Oropharynx is clear.  Eyes:     General: No scleral icterus.    Extraocular Movements: Extraocular movements intact.  Cardiovascular:     Rate and Rhythm: Normal rate and regular rhythm.     Heart sounds: Normal heart sounds. No murmur heard.    No friction rub. No gallop.  Pulmonary:     Effort: Pulmonary effort is normal. No respiratory distress.     Breath sounds: Normal breath sounds. No wheezing, rhonchi or rales.  Abdominal:     General: Bowel sounds are normal. There is no distension.     Palpations: Abdomen is soft.     Tenderness: There is no abdominal tenderness. There is no guarding or rebound.  Musculoskeletal:     Cervical back: Neck supple.     Right lower leg: No edema.     Left lower leg: No edema.  Skin:    General: Skin is warm and dry.     Coloration: Skin is not jaundiced or pale.  Neurological:     General: No focal deficit present.     Mental Status: She is alert and oriented to person, place, and time. Mental status is at baseline.  Psychiatric:        Mood and Affect: Mood normal.        Behavior: Behavior normal.        Thought Content: Thought content normal.        Judgment: Judgment normal.     Laboratory Data Recent Labs  Lab 02/26/23 0110 02/26/23 0749 02/26/23 1420 02/26/23 2249 02/27/23 0542  WBC 8.2 5.7  --   --  5.8  HGB 11.8* 11.7* 10.7* 11.0* 10.5*  HCT 36.3 36.0 33.8* 34.0* 32.4*  PLT 288 306  --   --  273   Recent Labs  Lab 02/26/23 0110  NA 139  K 3.6  CL 105  CO2 25  BUN 26*  CALCIUM 8.8*  PROT 7.9  BILITOT 0.3  ALKPHOS 78  ALT 15  AST 19  GLUCOSE 96   No results for input(s): "INR" in the last 168 hours.  Recent Labs    02/26/23 0110  LIPASE 44        Imaging Studies: CT ANGIO GI BLEED Result Date:  02/26/2023 CLINICAL DATA:  Lower GI bleeding with increase. Abdominal pain with diverticulitis. EXAM: CTA ABDOMEN AND PELVIS WITHOUT AND WITH CONTRAST TECHNIQUE: Multidetector CT imaging of the abdomen and pelvis was performed using the standard protocol during bolus administration of intravenous contrast. Multiplanar reconstructed images and MIPs were obtained and reviewed to evaluate the vascular anatomy. RADIATION DOSE REDUCTION: This exam was performed according to the departmental dose-optimization program which includes automated exposure control, adjustment of the mA and/or kV according to patient size and/or use of iterative reconstruction technique. CONTRAST:  OMNIPAQUE IOHEXOL 350 MG/ML SOLN COMPARISON:  None Available. FINDINGS: VASCULAR Aorta: Atheromatous wall thickening and calcification affecting the aorta, overall mild. Celiac: Separate origin of the celiac branches. There is some tortuosity of the left gastric artery but no convincing beading. No dissection or proximal stenosis. SMA: Vessels are smoothly contoured and diffusely patent. No evidence of aneurysm or vascular malformation. No proximal stenosis Renals: Vessels are smoothly contoured and diffusely patent. Negative for aneurysm or significant stenosis IMA: Patent Inflow: Unremarkable Proximal Outflow: Unremarkable Veins: Unremarkable Review of the MIP images confirms the above findings. NON-VASCULAR Lower chest: Bands of atelectasis or scarring in the lower lobes. Hepatobiliary: Innumerable hepatic cysts, some with peripheral calcification and septation, including in the high right lobe, but no solid mass or focal complexity. Negative gallbladder and bile ducts. Pancreas: Unremarkable Spleen: Unremarkable Adrenals/Urinary Tract: Innumerable renal cysts, some showing some elevated density compared to water, including exophytic from the posterior right kidney on 16:81 at 3.2 cm, but no solid or enhancing lesion is seen when pre and  postcontrast images are compared. No hydronephrosis or ureteral calculus. Unremarkable bladder. Stomach/Bowel: Innumerable sigmoid diverticula with diffusely thickened wall. There is superimposed fat inflammation and prominent Vasa recta and mesenteric node thickening locally. Dissecting gas collection wrapping inferior and lateral from the inferior sigmoid colon but no enhancing rind, likely large chronic diverticulum or sequela of prior diverticulitis. No drainable collection. Lymphatic: Enlarged pericolonic lymph nodes at the level of active diverticulitis. No generalized adenopathy. Reproductive: Small calcified fundal fibroid. Other: No ascites or pneumoperitoneum Musculoskeletal: Left hip replacement. Generalized lumbar spine degeneration with dextroscoliosis. IMPRESSION: 1. Sigmoid diverticulitis.  No detected active GI bleeding. 2. Polycystic disease of the kidneys and liver 3. Mild atherosclerosis. Electronically Signed   By: Tiburcio Pea M.D.   On: 02/26/2023 07:04    Assessment:    # Acute diverticulitis - demonstrated on ct scan  # Hematochezia/lgi bleeding 2/2 above - no active bleeding on CT  # acute anemia 2/2 above - hgb has stabilized post initial blood loss  # abdominal pain 2/2 above (initially)  # fhx colon cancer and colon polyps  Plan:  Recommend po cipro and flagyl for acute diverticulitis as outpatient Clear liquid diet for the next few days for bowel rest Needs colonoscopy in 6-8 weeks as outpatient with primary gastroenterologist. Will have office reach out to patient to schedule appt No planned inpatient endoscopic intervention given acute diverticulitis Protonix if patient has reflux should be continued. Otherwise can stop  Hold dvt ppx Monitor H&H.  Transfusion and resuscitation as per primary team Avoid frequent lab draws to prevent lab induced anemia Supportive care, ivf and antiemetics as  per primary team Maintain two sites IV access Avoid  nsaids Monitor for GIB. This has resolved  Management of other medical comorbidities as per primary team  I personally performed the service.  Thank you for allowing [place] to participate in this patient's care. Please don't hesitate to call if any questions or concerns arise.   Jaynie Collins, DO [facility]  Portions of the record may have been created with voice recognition software. Occasional wrong-word or 'sound-a-like' substitutions may have occurred due to the inherent limitations of voice recognition software.  Read the chart carefully and recognize, using context, where substitutions may have occurred.

## 2023-02-27 NOTE — Discharge Summary (Signed)
Physician Discharge Summary   Patient: Jodi Allison MRN: [id number] DOB: 03/29/1950  Admit date:     02/26/2023  Discharge date: 02/27/23  Discharge Physician: Regene Mccarthy   PCP: Jodi Regulus, MD   Recommendations at discharge:    Complete antibiotic therapy as prescribed Follow-up with GI as an outpatient  Discharge Diagnoses: Principal Problem:   Lower GI bleed Active Problems:   ABLA (acute blood loss anemia)   Diverticulitis   Hypertension   Depression  Resolved Problems:   * No resolved hospital problems. *  Hospital Course: Jodi Allison is a 72 y.o. female with medical history significant of HTN, paroxysmal SVT, diverticulosis, anxiety/depression, presented with new onset of lower GI bleed.   Symptoms started this morning, patient suddenly developed mild left lower quadrant abdominal pain and passed large amount of bright red blood, no tenesmus.  The patient went to her work and she passed another 2 episode of similar amount of bright red blood and continue to feel left lower quadrant cramping-like pain.  Denied any nausea, vomiting no fever or chills.  Then patient came to ED last night and then passed a smaller amount of red blood per rectum and this morning she felt abdominal pain is improved.     Assessment and Plan:  Acute blood loss anemia Acute hematochezia Lower GI bleed Acute diverticulitis -Secondary to acute diverticulitis.   - CTA abdomen pelvis showed no active bleeding and patient does report gradually decreased amount of blood per rectum -Serial H&H has been stable -Patient has been on antibiotics with ceftriaxone and Flagyl -Appreciate GI input, patient will be discharged home on oral antibiotics (ciprofloxacin and Flagyl)  and will follow-up with GI as an outpatient     Hypertension Continue amlodipine    Anxiety/depression -Stable - Continue Lexapro    Patient was seen and examined at the bedside and is in stable condition  for discharge.      Consultants: Gastroenterology Procedures performed: None Disposition: Home Diet recommendation:  Discharge Diet Orders (From admission, onward)     Start     Ordered   02/27/23 0000  Diet - low sodium heart healthy        02/27/23 1259           Cardiac diet DISCHARGE MEDICATION: Allergies as of 02/27/2023       Reactions   Lisinopril    Losartan    Penicillins    Sulfa Antibiotics         Medication List     STOP taking these medications    famotidine 40 MG tablet Commonly known as: PEPCID   hydrochlorothiazide 12.5 MG tablet Commonly known as: HYDRODIURIL   predniSONE 20 MG tablet Commonly known as: Deltasone       TAKE these medications    albuterol 108 (90 Base) MCG/ACT inhaler Commonly known as: VENTOLIN HFA Inhale 2 puffs into the lungs every 4 (four) hours as needed for wheezing.   amLODipine 5 MG tablet Commonly known as: NORVASC Take 5 mg by mouth daily.   calcium carbonate 1500 (600 Ca) MG Tabs tablet Commonly known as: OSCAL Take by mouth daily with breakfast.   ciprofloxacin 500 MG tablet Commonly known as: Cipro Take 1 tablet (500 mg total) by mouth 2 (two) times daily for 5 days.   diphenhydrAMINE 25 mg capsule Commonly known as: BENADRYL Take 1 capsule (25 mg total) by mouth every 4 (four) hours as needed.   escitalopram 10 MG tablet Commonly  known as: LEXAPRO Take 10 mg by mouth daily.   metroNIDAZOLE 500 MG tablet Commonly known as: Flagyl Take 1 tablet (500 mg total) by mouth 3 (three) times daily for 5 days.   pravastatin 20 MG tablet Commonly known as: PRAVACHOL Take 1 tablet by mouth at bedtime.        Discharge Exam: Filed Weights   02/26/23 0108  Weight: 63.5 kg    Eyes: PERRL, lids and conjunctivae normal ENMT: Mucous membranes are moist. Posterior pharynx clear of any exudate or lesions.Normal dentition.  Neck: normal, supple, no masses, no thyromegaly Respiratory: clear to  auscultation bilaterally, no wheezing, no crackles. Normal respiratory effort. No accessory muscle use.  Cardiovascular: Regular rate and rhythm, no murmurs / rubs / gallops. No extremity edema. 2+ pedal pulses. No carotid bruits.  Abdomen: Mild tenderness on left lower quadrant, no rebound no guarding, no masses palpated. No hepatosplenomegaly. Bowel sounds positive.  Musculoskeletal: no clubbing / cyanosis. No joint deformity upper and lower extremities. Good ROM, no contractures. Normal muscle tone.  Skin: no rashes, lesions, ulcers. No induration Neurologic: CN 2-12 grossly intact. Sensation intact, DTR normal. Strength 5/5 in all 4.  Psychiatric: Normal judgment and insight. Alert and oriented x 3. Normal mood.       Condition at discharge: stable  The results of significant diagnostics from this hospitalization (including imaging, microbiology, ancillary and laboratory) are listed below for reference.   Imaging Studies: CT ANGIO GI BLEED Result Date: 02/26/2023 CLINICAL DATA:  Lower GI bleeding with increase. Abdominal pain with diverticulitis. EXAM: CTA ABDOMEN AND PELVIS WITHOUT AND WITH CONTRAST TECHNIQUE: Multidetector CT imaging of the abdomen and pelvis was performed using the standard protocol during bolus administration of intravenous contrast. Multiplanar reconstructed images and MIPs were obtained and reviewed to evaluate the vascular anatomy. RADIATION DOSE REDUCTION: This exam was performed according to the departmental dose-optimization program which includes automated exposure control, adjustment of the mA and/or kV according to patient size and/or use of iterative reconstruction technique. CONTRAST:  OMNIPAQUE IOHEXOL 350 MG/ML SOLN COMPARISON:  None Available. FINDINGS: VASCULAR Aorta: Atheromatous wall thickening and calcification affecting the aorta, overall mild. Celiac: Separate origin of the celiac branches. There is some tortuosity of the left gastric artery but no  convincing beading. No dissection or proximal stenosis. SMA: Vessels are smoothly contoured and diffusely patent. No evidence of aneurysm or vascular malformation. No proximal stenosis Renals: Vessels are smoothly contoured and diffusely patent. Negative for aneurysm or significant stenosis IMA: Patent Inflow: Unremarkable Proximal Outflow: Unremarkable Veins: Unremarkable Review of the MIP images confirms the above findings. NON-VASCULAR Lower chest: Bands of atelectasis or scarring in the lower lobes. Hepatobiliary: Innumerable hepatic cysts, some with peripheral calcification and septation, including in the high right lobe, but no solid mass or focal complexity. Negative gallbladder and bile ducts. Pancreas: Unremarkable Spleen: Unremarkable Adrenals/Urinary Tract: Innumerable renal cysts, some showing some elevated density compared to water, including exophytic from the posterior right kidney on 16:81 at 3.2 cm, but no solid or enhancing lesion is seen when pre and postcontrast images are compared. No hydronephrosis or ureteral calculus. Unremarkable bladder. Stomach/Bowel: Innumerable sigmoid diverticula with diffusely thickened wall. There is superimposed fat inflammation and prominent Vasa recta and mesenteric node thickening locally. Dissecting gas collection wrapping inferior and lateral from the inferior sigmoid colon but no enhancing rind, likely large chronic diverticulum or sequela of prior diverticulitis. No drainable collection. Lymphatic: Enlarged pericolonic lymph nodes at the level of active diverticulitis. No generalized  adenopathy. Reproductive: Small calcified fundal fibroid. Other: No ascites or pneumoperitoneum Musculoskeletal: Left hip replacement. Generalized lumbar spine degeneration with dextroscoliosis. IMPRESSION: 1. Sigmoid diverticulitis.  No detected active GI bleeding. 2. Polycystic disease of the kidneys and liver 3. Mild atherosclerosis. Electronically Signed   By: Tiburcio Pea  M.D.   On: 02/26/2023 07:04    Microbiology: Results for orders placed or performed during the hospital encounter of 09/13/19  SARS CORONAVIRUS 2 (TAT 6-24 HRS) Nasopharyngeal Nasopharyngeal Swab     Status: None   Collection Time: 09/13/19 11:13 AM   Specimen: Nasopharyngeal Swab  Result Value Ref Range Status   SARS Coronavirus 2 NEGATIVE NEGATIVE Final    Comment: (NOTE) SARS-CoV-2 target nucleic acids are NOT DETECTED.  The SARS-CoV-2 RNA is generally detectable in upper and lower respiratory specimens during the acute phase of infection. Negative results do not preclude SARS-CoV-2 infection, do not rule out co-infections with other pathogens, and should not be used as the sole basis for treatment or other patient management decisions. Negative results must be combined with clinical observations, patient history, and epidemiological information. The expected result is Negative.  Fact Sheet for Patients: HairSlick.no  Fact Sheet for Healthcare Providers: quierodirigir.com  This test is not yet approved or cleared by the [place] FDA and  has been authorized for detection and/or diagnosis of SARS-CoV-2 by FDA under an Emergency Use Authorization (EUA). This EUA will remain  in effect (meaning this test can be used) for the duration of the COVID-19 declaration under Se ction 564(b)(1) of the Act, 21 U.S.C. section 360bbb-3(b)(1), unless the authorization is terminated or revoked sooner.  Performed at [facility], [address], [place], [place] [address]     Labs: CBC: Recent Labs  Lab 02/26/23 0110 02/26/23 0749 02/26/23 1420 02/26/23 2249 02/27/23 0542  WBC 8.2 5.7  --   --  5.8  NEUTROABS 4.5  --   --   --   --   HGB 11.8* 11.7* 10.7* 11.0* 10.5*  HCT 36.3 36.0 33.8* 34.0* 32.4*  MCV 85.2 82.4  --   --  84.6  PLT 288 306  --   --  273   Basic Metabolic Panel: Recent Labs  Lab  02/26/23 0110  NA 139  K 3.6  CL 105  CO2 25  GLUCOSE 96  BUN 26*  CREATININE 0.65  CALCIUM 8.8*   Liver Function Tests: Recent Labs  Lab 02/26/23 0110  AST 19  ALT 15  ALKPHOS 78  BILITOT 0.3  PROT 7.9  ALBUMIN 4.0   CBG: No results for input(s): "GLUCAP" in the last 168 hours.  Discharge time spent: greater than 30 minutes.  Signed: Lucile Shutters, MD [facility] 02/27/2023

## 2023-02-27 NOTE — Care Management CC44 (Signed)
Condition Code 44 Documentation Completed  Patient Details  Name: Jodi Allison MRN: [id number] Date of Birth: 1950-10-16   Condition Code 44 given:  Yes Patient signature on Condition Code 44 notice:  Yes Documentation of 2 MD's agreement:  Yes Code 44 added to claim:  Yes    Susa Simmonds, LCSWA 02/27/2023, 12:57 PM

## 2023-02-27 NOTE — ED Notes (Signed)
Patient Alert and oriented to baseline. Stable and ambulatory to baseline. Patient verbalized understanding of the discharge instructions.  Patient belongings were taken by the patient.   

## 2023-03-24 ENCOUNTER — Other Ambulatory Visit: Payer: Self-pay | Admitting: Internal Medicine

## 2023-03-24 DIAGNOSIS — Z1231 Encounter for screening mammogram for malignant neoplasm of breast: Secondary | ICD-10-CM

## 2023-04-19 ENCOUNTER — Encounter: Payer: Self-pay | Admitting: Internal Medicine

## 2023-04-21 ENCOUNTER — Ambulatory Visit
Admission: RE | Admit: 2023-04-21 | Discharge: 2023-04-21 | Disposition: A | Discharge status: Home / Self Care | Payer: Medicare HMO | Source: Ambulatory Visit | Attending: Internal Medicine | Admitting: Internal Medicine

## 2023-04-21 DIAGNOSIS — Z1231 Encounter for screening mammogram for malignant neoplasm of breast: Secondary | ICD-10-CM | POA: Diagnosis present

## 2023-04-27 ENCOUNTER — Ambulatory Visit
Admission: RE | Admit: 2023-04-27 | Discharge: 2023-04-27 | Disposition: A | Discharge status: Home / Self Care | Payer: Medicare HMO | Attending: Internal Medicine | Admitting: Internal Medicine

## 2023-04-27 ENCOUNTER — Other Ambulatory Visit: Payer: Self-pay

## 2023-04-27 ENCOUNTER — Ambulatory Visit: Payer: Medicare HMO | Admitting: Certified Registered Nurse Anesthetist

## 2023-04-27 ENCOUNTER — Encounter: Payer: Self-pay | Admitting: Internal Medicine

## 2023-04-27 ENCOUNTER — Encounter
Admission: RE | Disposition: A | Discharge status: Home / Self Care | Payer: Self-pay | Source: Home / Self Care | Attending: Internal Medicine

## 2023-04-27 DIAGNOSIS — K219 Gastro-esophageal reflux disease without esophagitis: Secondary | ICD-10-CM | POA: Insufficient documentation

## 2023-04-27 DIAGNOSIS — K56699 Other intestinal obstruction unspecified as to partial versus complete obstruction: Secondary | ICD-10-CM | POA: Diagnosis not present

## 2023-04-27 DIAGNOSIS — I4891 Unspecified atrial fibrillation: Secondary | ICD-10-CM | POA: Diagnosis not present

## 2023-04-27 DIAGNOSIS — K5733 Diverticulitis of large intestine without perforation or abscess with bleeding: Secondary | ICD-10-CM | POA: Diagnosis not present

## 2023-04-27 DIAGNOSIS — K640 First degree hemorrhoids: Secondary | ICD-10-CM | POA: Diagnosis not present

## 2023-04-27 DIAGNOSIS — R933 Abnormal findings on diagnostic imaging of other parts of digestive tract: Secondary | ICD-10-CM | POA: Insufficient documentation

## 2023-04-27 DIAGNOSIS — I10 Essential (primary) hypertension: Secondary | ICD-10-CM | POA: Insufficient documentation

## 2023-04-27 DIAGNOSIS — K6389 Other specified diseases of intestine: Secondary | ICD-10-CM | POA: Diagnosis not present

## 2023-04-27 DIAGNOSIS — K921 Melena: Secondary | ICD-10-CM | POA: Diagnosis present

## 2023-04-27 HISTORY — DX: Anemia, unspecified: D64.9

## 2023-04-27 HISTORY — PX: COLONOSCOPY WITH PROPOFOL: SHX5780

## 2023-04-27 HISTORY — DX: Depression, unspecified: F32.A

## 2023-04-27 HISTORY — DX: Hyperlipidemia, unspecified: E78.5

## 2023-04-27 HISTORY — DX: Acute posthemorrhagic anemia: D62

## 2023-04-27 HISTORY — DX: Diverticulitis of intestine, part unspecified, without perforation or abscess without bleeding: K57.92

## 2023-04-27 HISTORY — DX: Gastro-esophageal reflux disease without esophagitis: K21.9

## 2023-04-27 HISTORY — DX: Gastrointestinal hemorrhage, unspecified: K92.2

## 2023-04-27 HISTORY — PX: BIOPSY: SHX5522

## 2023-04-27 SURGERY — COLONOSCOPY WITH PROPOFOL
Anesthesia: General

## 2023-04-27 MED ORDER — SODIUM CHLORIDE 0.9 % IV SOLN: INTRAVENOUS | Status: DC

## 2023-04-27 MED ORDER — PROPOFOL 10 MG/ML IV BOLUS
INTRAVENOUS | Status: DC | PRN
  Administered 2023-04-27: 125 ug/kg/min via INTRAVENOUS
  Administered 2023-04-27: 80 mg via INTRAVENOUS

## 2023-04-27 MED ORDER — LIDOCAINE HCL (CARDIAC) PF 100 MG/5ML IV SOSY
PREFILLED_SYRINGE | INTRAVENOUS | Status: DC | PRN
  Administered 2023-04-27: 50 mg via INTRAVENOUS

## 2023-04-27 NOTE — H&P (Signed)
 Outpatient short stay form Pre-procedure 04/27/2023 12:38 PM Jodi Allison K. Norma Fredrickson, M.D.  Primary Physician: Einar Crow, M.D.  Reason for visit:  Rectal bleeding, abnormal CT scan of colon  History of present illness: Patient presents for encounter for diagnostic colonoscopy based upon abnormal CT scanning of the abdomen and pelvis on 02/26/2023 showing sigmoid diverticulitis without any active gastrointestinal bleeding. Rectal bleeding. Abnormal CT colon, Personal history of adenomatous polyps and colon cancer. Schedule diagnostic colonoscopy with monitored anesthesia due to severe systemic illness. Procedure information given: indications, benefits, risks- including, but not limited to bleeding, infection, perforation, difficulty with sedation, were discussed with the patient, and they are agreeable to the procedure  Follow up as needed after procedure, sooner if problems  COLONOSCOPY 09/17/2019  Diverticulosis/Otherwise normal colon/PHx CP/Repeat 34yrs/TKT   Current Facility-Administered Medications:    0.9 %  sodium chloride infusion, , Intravenous, Continuous, [place], Boykin Nearing, MD, Last Rate: 20 mL/hr at 04/27/23 1139, New Bag at 04/27/23 1139  Medications Prior to Admission  Medication Sig Dispense Refill Last Dose/Taking   albuterol (VENTOLIN HFA) 108 (90 Base) MCG/ACT inhaler Inhale 2 puffs into the lungs every 4 (four) hours as needed for wheezing.   04/26/2023   amLODipine (NORVASC) 5 MG tablet Take 5 mg by mouth daily.   04/26/2023   calcium carbonate (OSCAL) 1500 (600 Ca) MG TABS tablet Take by mouth daily with breakfast.   04/26/2023   escitalopram (LEXAPRO) 10 MG tablet Take 10 mg by mouth daily.   04/26/2023   pravastatin (PRAVACHOL) 20 MG tablet Take 1 tablet by mouth at bedtime.   04/26/2023   diphenhydrAMINE (BENADRYL) 25 mg capsule Take 1 capsule (25 mg total) by mouth every 4 (four) hours as needed. 30 capsule 0      Allergies  Allergen Reactions   Lisinopril     Losartan    Penicillins    Sulfa Antibiotics      Past Medical History:  Diagnosis Date   ABLA (acute blood loss anemia)    Anemia    Cancer (HCC)    BASAL CELL CARCINOMA   Depression    Diverticulitis    Dysrhythmia    GERD (gastroesophageal reflux disease)    Heme positive stool    History of shingles    Hyperlipidemia    Hypertension    Lower GI bleed    Osteoporosis    SVT (supraventricular tachycardia) (HCC)    TREATED WITH AV NODE ABLATION    Review of systems:  Otherwise negative.    Physical Exam  Gen: Alert, oriented. Appears stated age.  HEENT: [place]/AT. PERRLA. Lungs: CTA, no wheezes. CV: RR nl S1, S2. Abd: soft, benign, no masses. BS+ Ext: No edema. Pulses 2+    Planned procedures: Proceed with colonoscopy. The patient understands the nature of the planned procedure, indications, risks, alternatives and potential complications including but not limited to bleeding, infection, perforation, damage to internal organs and possible oversedation/side effects from anesthesia. The patient agrees and gives consent to proceed.  Please refer to procedure notes for findings, recommendations and patient disposition/instructions.     Jodi Allison K. Norma Fredrickson, M.D. Gastroenterology 04/27/2023  12:38 PM

## 2023-04-27 NOTE — Anesthesia Postprocedure Evaluation (Signed)
 Anesthesia Post Note  Patient: Jodi Allison  Procedure(s) Performed: COLONOSCOPY WITH PROPOFOL BIOPSY  Patient location during evaluation: PACU Anesthesia Type: General Level of consciousness: awake Pain management: satisfactory to patient Vital Signs Assessment: post-procedure vital signs reviewed and stable Respiratory status: spontaneous breathing Cardiovascular status: blood pressure returned to baseline Anesthetic complications: no   No notable events documented.   Last Vitals:  Vitals:   04/27/23 1323 04/27/23 1330  BP: (!) 145/68 138/77  Pulse: 79 81  Resp: 15 17  Temp:    SpO2: 99% 99%    Last Pain:  Vitals:   04/27/23 1330  TempSrc:   PainSc: 0-No pain                 VAN STAVEREN,Joseguadalupe Stan

## 2023-04-27 NOTE — Transfer of Care (Signed)
 Immediate Anesthesia Transfer of Care Note  Patient: Jodi Allison  Procedure(s) Performed: COLONOSCOPY WITH PROPOFOL BIOPSY  Patient Location: Endoscopy Unit  Anesthesia Type:General  Level of Consciousness: awake and drowsy  Airway & Oxygen Therapy: Patient Spontanous Breathing  Post-op Assessment: Report given to RN and Post -op Vital signs reviewed and stable  Post vital signs: Reviewed and stable  Last Vitals:  Vitals Value Taken Time  BP 153/80 04/27/23 1311  Temp    Pulse 87 04/27/23 1311  Resp 20 04/27/23 1311  SpO2 100 % 04/27/23 1311  Vitals shown include unfiled device data.  Last Pain:  Vitals:   04/27/23 1129  TempSrc: Temporal  PainSc: 0-No pain         Complications: No notable events documented.

## 2023-04-27 NOTE — Anesthesia Preprocedure Evaluation (Addendum)
 Anesthesia Evaluation  Patient identified by MRN, date of birth, ID band Patient awake    Reviewed: Allergy & Precautions, NPO status , Patient's Chart, lab work & pertinent test results  Airway Mallampati: II  TM Distance: >3 FB Neck ROM: full    Dental  (+) Teeth Intact   Pulmonary neg pulmonary ROS   Pulmonary exam normal        Cardiovascular Exercise Tolerance: Good hypertension, Pt. on medications + dysrhythmias Atrial Fibrillation  Rhythm:Regular Rate:Normal     Neuro/Psych negative neurological ROS  negative psych ROS   GI/Hepatic negative GI ROS, Neg liver ROS,GERD  Medicated,,  Endo/Other  negative endocrine ROS    Renal/GU negative Renal ROS  negative genitourinary   Musculoskeletal   Abdominal Normal abdominal exam  (+)   Peds negative pediatric ROS (+)  Hematology negative hematology ROS (+)   Anesthesia Other Findings Past Medical History: No date: ABLA (acute blood loss anemia) No date: Anemia No date: Cancer (HCC)     Comment:  BASAL CELL CARCINOMA No date: Depression No date: Diverticulitis No date: Dysrhythmia No date: GERD (gastroesophageal reflux disease) No date: Heme positive stool No date: History of shingles No date: Hyperlipidemia No date: Hypertension No date: Lower GI bleed No date: Osteoporosis No date: SVT (supraventricular tachycardia) (HCC)     Comment:  TREATED WITH AV NODE ABLATION  Past Surgical History: No date: ANKLE FRACTURE SURGERY 2000: CARDIAC ELECTROPHYSIOLOGY MAPPING AND ABLATION No date: COLONOSCOPY No date: COLONOSCOPY 09/17/2019: COLONOSCOPY WITH PROPOFOL; N/A     Comment:  Procedure: COLONOSCOPY WITH PROPOFOL;  Surgeon: [place],               Boykin Nearing, MD;  Location: [facility] ENDOSCOPY;  Service:               Endoscopy;  Laterality: N/A; No date: ESOPHAGOGASTRODUODENOSCOPY No date: EXCISION OF BASAL CELL CARCINOMA No date: FRACTURE SURGERY No date:  JOINT REPLACEMENT; Left No date: TOTAL HIP ARTHROPLASTY  BMI    Body Mass Index: 23.91 kg/m      Reproductive/Obstetrics negative OB ROS                              Anesthesia Physical Anesthesia Plan  ASA: 2  Anesthesia Plan: General   Post-op Pain Management:    Induction: Intravenous  PONV Risk Score and Plan: Propofol infusion and TIVA  Airway Management Planned: Natural Airway and Nasal Cannula  Additional Equipment:   Intra-op Plan:   Post-operative Plan:   Informed Consent: I have reviewed the patients History and Physical, chart, labs and discussed the procedure including the risks, benefits and alternatives for the proposed anesthesia with the patient or authorized representative who has indicated his/her understanding and acceptance.     Dental Advisory Given  Plan Discussed with: CRNA  Anesthesia Plan Comments:         Anesthesia Quick Evaluation

## 2023-04-27 NOTE — Anesthesia Procedure Notes (Signed)
 Date/Time: 04/27/2023 12:52 PM  Performed by: Ginger Carne, CRNAPre-anesthesia Checklist: Patient identified, Emergency Drugs available, Suction available, Patient being monitored and Timeout performed Patient Re-evaluated:Patient Re-evaluated prior to induction Oxygen Delivery Method: Nasal cannula Preoxygenation: Pre-oxygenation with 100% oxygen Induction Type: IV induction

## 2023-04-27 NOTE — Interval H&P Note (Signed)
 History and Physical Interval Note:  04/27/2023 12:41 PM  Jodi Allison  has presented today for surgery, with the diagnosis of rectal bleeding,abnormal CT scan of GI tracat.  The various methods of treatment have been discussed with the patient and family. After consideration of risks, benefits and other options for treatment, the patient has consented to  Procedure(s): COLONOSCOPY WITH PROPOFOL (N/A) as a surgical intervention.  The patient's history has been reviewed, patient examined, no change in status, stable for surgery.  I have reviewed the patient's chart and labs.  Questions were answered to the patient's satisfaction.     [place], [place]

## 2023-04-27 NOTE — Op Note (Signed)
 [facility] Gastroenterology Patient Name: Jodi Allison Procedure Date: 04/27/2023 11:40 AM MRN: [id number] Account #: [id number] Date of Birth: [birth year] Admit Type: Outpatient Age: 73 Room: [facility] ENDO ROOM 2 Gender: Female Note Status: Finalized Instrument Name: Prentice Docker [id number] Procedure:             Colonoscopy Indications:           Hematochezia, Abnormal CT of the GI tract Providers:             Boykin Nearing. Norma Fredrickson MD, MD Referring MD:          Marya Amsler. Dareen Piano MD, MD (Referring MD) Medicines:             Propofol per Anesthesia Complications:         No immediate complications. Estimated blood loss:                         Minimal. Procedure:             Pre-Anesthesia Assessment:                        - The risks and benefits of the procedure and the                         sedation options and risks were discussed with the                         patient. All questions were answered and informed                         consent was obtained.                        - Patient identification and proposed procedure were                         verified prior to the procedure by the nurse. The                         procedure was verified in the procedure room.                        - ASA Grade Assessment: III - A patient with severe                         systemic disease.                        - After reviewing the risks and benefits, the patient                         was deemed in satisfactory condition to undergo the                         procedure.                        After obtaining informed consent, the colonoscope was  passed under direct vision. Throughout the procedure,                         the patient's blood pressure, pulse, and oxygen                         saturations were monitored continuously. The                         Colonoscope was introduced through the anus with the                          intention of advancing to the cecum. The scope was                         advanced to the sigmoid colon before the procedure was                         aborted. Medications were given. The colonoscopy was                         technically difficult and complex due to extrinsic                         compression, multiple diverticula in the colon and                         mucosal inflammation with edema. The patient tolerated                         the procedure well. The quality of the bowel                         preparation was good. Findings:      The perianal and digital rectal examinations were normal. Pertinent       negatives include normal sphincter tone and no palpable rectal lesions.      Non-bleeding internal hemorrhoids were found during retroflexion. The       hemorrhoids were Grade I (internal hemorrhoids that do not prolapse).      An area of significantly congested mucosa was found in the distal       sigmoid colon. Estimated blood loss was minimal.      A benign-appearing, intrinsic severe stenosis was found in the distal       sigmoid colon and was non-traversed. Biopsies were taken with a cold       forceps for histology. Due to stenosis and swelling, the procedure was       aborted at this point and the scope was withdrawn. Estimated blood loss       was minimal. Impression:            - Non-bleeding internal hemorrhoids.                        - Congested mucosa in the distal sigmoid colon.                        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 Biopsied. Recommendation:        -  Patient has a contact number available for                         emergencies. The signs and symptoms of potential                         delayed complications were discussed with the patient.                         Return to normal activities tomorrow. Written                         discharge instructions were provided to the patient.                        - Low fiber  diet for 3 days.                        - Cipro (ciprofloxacin) 500 mg PO BID for 2 weeks.                        - Flagyl (metronidazole) 500 mg PO TID for 2 weeks.                        - Follow up with Vevelyn Pat, GI Nurse                         Practioner, in office to discuss results and monitor                         progress.                        - Telephone GI office to schedule appointment in 3                         weeks.                        - Repeat colonoscopy in 3 months because the                         examination was incomplete.                        - The findings and recommendations were discussed with                         the patient. Procedure Code(s):     --- Professional ---                        [phone], 52, Colonoscopy, flexible; with biopsy, single                         or multiple Diagnosis Code(s):     --- Professional ---                        R93.3, Abnormal findings on diagnostic imaging of  other parts of digestive tract                        K92.1, Melena (includes Hematochezia)                        K64.0, First degree hemorrhoids                        K56.699, Other intestinal obstruction unspecified as                         to partial versus complete obstruction                        K63.89, Other specified diseases of intestine CPT copyright 2022 American Medical Association. All rights reserved. The codes documented in this report are preliminary and upon coder review may  be revised to meet current compliance requirements. Stanton Kidney MD, MD 04/27/2023 1:19:24 PM This report has been signed electronically. Number of Addenda: 0 Note Initiated On: 04/27/2023 11:40 AM Total Procedure Duration: 0 hours 10 minutes 55 seconds  Estimated Blood Loss:  Estimated blood loss was minimal.      [facility]

## 2023-04-28 ENCOUNTER — Encounter: Payer: Self-pay | Admitting: Internal Medicine

## 2023-04-29 LAB — SURGICAL PATHOLOGY

## 2023-05-10 ENCOUNTER — Other Ambulatory Visit: Payer: Self-pay | Admitting: Gastroenterology

## 2023-05-10 DIAGNOSIS — R933 Abnormal findings on diagnostic imaging of other parts of digestive tract: Secondary | ICD-10-CM

## 2023-05-10 DIAGNOSIS — K5732 Diverticulitis of large intestine without perforation or abscess without bleeding: Secondary | ICD-10-CM

## 2023-05-17 ENCOUNTER — Ambulatory Visit
Admission: RE | Admit: 2023-05-17 | Discharge: 2023-05-17 | Disposition: A | Discharge status: Home / Self Care | Source: Ambulatory Visit | Attending: Gastroenterology | Admitting: Gastroenterology

## 2023-05-17 DIAGNOSIS — R933 Abnormal findings on diagnostic imaging of other parts of digestive tract: Secondary | ICD-10-CM | POA: Insufficient documentation

## 2023-05-17 DIAGNOSIS — K5732 Diverticulitis of large intestine without perforation or abscess without bleeding: Secondary | ICD-10-CM | POA: Diagnosis present

## 2023-05-17 MED ORDER — IOHEXOL 300 MG/ML  SOLN
80.0000 mL | Freq: Once | INTRAMUSCULAR | Status: AC | PRN
  Administered 2023-05-17: 80 mL via INTRAVENOUS

## 2023-05-17 MED ORDER — BARIUM SULFATE 2 % PO SUSP
450.0000 mL | Freq: Once | ORAL | Status: AC
  Administered 2023-05-17: 450 mL via ORAL

## 2023-06-23 ENCOUNTER — Encounter: Payer: Self-pay | Admitting: Internal Medicine

## 2023-07-05 ENCOUNTER — Encounter: Payer: Self-pay | Admitting: Internal Medicine

## 2023-07-05 ENCOUNTER — Encounter
Admission: RE | Disposition: A | Discharge status: Home / Self Care | Payer: Self-pay | Source: Home / Self Care | Attending: Internal Medicine

## 2023-07-05 ENCOUNTER — Ambulatory Visit
Admission: RE | Admit: 2023-07-05 | Discharge: 2023-07-05 | Disposition: A | Discharge status: Home / Self Care | Attending: Internal Medicine | Admitting: Internal Medicine

## 2023-07-05 ENCOUNTER — Ambulatory Visit: Admitting: Anesthesiology

## 2023-07-05 DIAGNOSIS — K640 First degree hemorrhoids: Secondary | ICD-10-CM | POA: Diagnosis not present

## 2023-07-05 DIAGNOSIS — I471 Supraventricular tachycardia, unspecified: Secondary | ICD-10-CM | POA: Diagnosis not present

## 2023-07-05 DIAGNOSIS — I351 Nonrheumatic aortic (valve) insufficiency: Secondary | ICD-10-CM | POA: Insufficient documentation

## 2023-07-05 DIAGNOSIS — Z83719 Family history of colon polyps, unspecified: Secondary | ICD-10-CM | POA: Diagnosis not present

## 2023-07-05 DIAGNOSIS — Z7951 Long term (current) use of inhaled steroids: Secondary | ICD-10-CM | POA: Diagnosis not present

## 2023-07-05 DIAGNOSIS — C180 Malignant neoplasm of cecum: Secondary | ICD-10-CM | POA: Diagnosis not present

## 2023-07-05 DIAGNOSIS — Z800 Family history of malignant neoplasm of digestive organs: Secondary | ICD-10-CM | POA: Insufficient documentation

## 2023-07-05 DIAGNOSIS — K56699 Other intestinal obstruction unspecified as to partial versus complete obstruction: Secondary | ICD-10-CM | POA: Diagnosis not present

## 2023-07-05 DIAGNOSIS — Z79899 Other long term (current) drug therapy: Secondary | ICD-10-CM | POA: Diagnosis not present

## 2023-07-05 DIAGNOSIS — R933 Abnormal findings on diagnostic imaging of other parts of digestive tract: Secondary | ICD-10-CM | POA: Insufficient documentation

## 2023-07-05 DIAGNOSIS — K219 Gastro-esophageal reflux disease without esophagitis: Secondary | ICD-10-CM | POA: Insufficient documentation

## 2023-07-05 DIAGNOSIS — I10 Essential (primary) hypertension: Secondary | ICD-10-CM | POA: Diagnosis not present

## 2023-07-05 DIAGNOSIS — K5730 Diverticulosis of large intestine without perforation or abscess without bleeding: Secondary | ICD-10-CM | POA: Diagnosis not present

## 2023-07-05 DIAGNOSIS — F32A Depression, unspecified: Secondary | ICD-10-CM | POA: Diagnosis not present

## 2023-07-05 SURGERY — COLONOSCOPY
Anesthesia: General

## 2023-07-05 MED ORDER — PROPOFOL 10 MG/ML IV BOLUS
INTRAVENOUS | Status: AC
  Filled 2023-07-05: qty 20

## 2023-07-05 MED ORDER — PROPOFOL 500 MG/50ML IV EMUL
INTRAVENOUS | Status: DC | PRN
  Administered 2023-07-05: 140 ug/kg/min via INTRAVENOUS

## 2023-07-05 MED ORDER — PROPOFOL 10 MG/ML IV BOLUS
INTRAVENOUS | Status: DC | PRN
  Administered 2023-07-05: 80 mg via INTRAVENOUS
  Administered 2023-07-05: 20 mg via INTRAVENOUS

## 2023-07-05 MED ORDER — SODIUM CHLORIDE 0.9 % IV SOLN: INTRAVENOUS | Status: DC

## 2023-07-05 MED ORDER — LIDOCAINE HCL (PF) 2 % IJ SOLN
INTRAMUSCULAR | Status: AC
  Filled 2023-07-05: qty 15

## 2023-07-05 MED ORDER — LIDOCAINE HCL (CARDIAC) PF 100 MG/5ML IV SOSY
PREFILLED_SYRINGE | INTRAVENOUS | Status: DC | PRN
  Administered 2023-07-05: 40 mg via INTRAVENOUS

## 2023-07-05 MED ORDER — SPOT INK MARKER SYRINGE KIT
PACK | SUBMUCOSAL | Status: DC | PRN
  Administered 2023-07-05: 5 mL via SUBMUCOSAL

## 2023-07-05 NOTE — Transfer of Care (Signed)
 Immediate Anesthesia Transfer of Care Note  Patient: Jodi Allison  Procedure(s) Performed: COLONOSCOPY  Patient Location: Endoscopy Unit  Anesthesia Type:General  Level of Consciousness: drowsy  Airway & Oxygen Therapy: Patient Spontanous Breathing  Post-op Assessment: Report given to RN and Post -op Vital signs reviewed and stable  Post vital signs: Reviewed and stable  Last Vitals:  Vitals Value Taken Time  BP 99/51 07/05/23 1140  Temp    Pulse 80 07/05/23 1141  Resp 14 07/05/23 1141  SpO2 99 % 07/05/23 1141  Vitals shown include unfiled device data.  Last Pain:  Vitals:   07/05/23 1020  TempSrc: Temporal  PainSc: 0-No pain         Complications: No notable events documented.

## 2023-07-05 NOTE — H&P (Signed)
 Outpatient short stay form Pre-procedure 07/05/2023 10:33 AM Jodi Allison Jodi Allison, M.D.  Primary Physician: Jodi Allison, M.D.  Reason for visit:    History of present illness:  Jodi Allison is a 73 y.o. female who presents for a visit to the GI clinic at [facility] for a visit to follow up  PROBLEM LIST:  Patient Active Problem List  Diagnosis  GERD (gastroesophageal reflux disease)  HTN (hypertension), benign  Osteoporosis  Hyperlipidemia  Health care maintenance  Status post total replacement of left hip  Heart palpitations  Depression  Diverticulitis  Lower GI bleed  ABLA (acute blood loss anemia)   Endoscopic History: Colonoscopy 04/27/23- Dr Jodi Allison- congestion in sigmoid colon, benign appearing intrinsic severe stenosis in distal sigmoid that was not traversed. Biopsies taken, demonstrated some mild chronic active colitis in one of 3 samples, negative for granulomas- however there was concern for IBD on path not but to consider other diagnostic possibilities. She was retreated for diverticulitis with cipro /flagyl  x 2w Colonoscopy 09/17/19- Dr Jodi Allison- diverticulosis. 5y repeat EGD 05/06/14- Dr Jodi Allison (heme +/gerd)- normal exam Colonoscopy 2.29/16- Dr Jodi Allison (heme+)- hemorrhoids Colonoscopy 03/10/10- (Dr Jodi Allison)diverticulosis Colonoscopy 2006- Dr Jodi Allison- adenomatous colon polyp follow up- cecal polyp (hyperplastic)/sigmoid polyp (benign granular tissue) EGD 2003- (Dr Jodi Allison, gerd)- esophageal polyp, gastritis. There was no H pylori, Barretts, dysplasia/malignancy/celiac sprue Colonoscopy 2003- (Dr Jodi Allison- family history of colon polyps)- adenomatous sigmoid polyp  CT 12/24- IMPRESSION:  1. Sigmoid diverticulitis. No detected active GI bleeding.  2. Polycystic disease of the kidneys and liver  3. Mild atherosclerosis.  [name] was last seen on 04/12/23 Rectal bleeding. Abnormal CT colon, Personal history of adenomatous polyps and colon cancer. Schedule diagnostic colonoscopy  with monitored anesthesia due to severe systemic illness. Procedure information given: indications, benefits, risks- including, but not limited to bleeding, infection, perforation, difficulty with sedation, were discussed with the patient, and they are agreeable to the procedure  Follow up as needed after procedure, sooner if problems .  Since the last visit, had colonoscopy as above. States she took the antibiotics as prescribed but due to side effects of dizziness, nausea and leg pain could not take the last 2d- those symptoms have improved. States she is not having any rectal bleeding and her pain has improved- had some diarrhea at the end of the antibiotics but this has improved. Denies abdominal pain and rectal bleeding. We discussed her exams, history/symptoms/ plan of care. Her questions were answered. Do also note it was her PGM who had colon cancer and her father had colon polyps. Patient has had adenomatous polyps in past    Current Facility-Administered Medications:    0.9 %  sodium chloride  infusion, , Intravenous, Continuous, Jodi Allison K, MD  Medications Prior to Admission  Medication Sig Dispense Refill Last Dose/Taking   amLODipine (NORVASC) 5 MG tablet Take 5 mg by mouth daily.   07/04/2023 Evening   calcium carbonate (OSCAL) 1500 (600 Ca) MG TABS tablet Take by mouth daily with breakfast.   Past Week   escitalopram  (LEXAPRO ) 10 MG tablet Take 10 mg by mouth daily.   07/04/2023   albuterol (VENTOLIN HFA) 108 (90 Base) MCG/ACT inhaler Inhale 2 puffs into the lungs every 4 (four) hours as needed for wheezing.      diphenhydrAMINE  (BENADRYL ) 25 mg capsule Take 1 capsule (25 mg total) by mouth every 4 (four) hours as needed. 30 capsule 0    pravastatin (PRAVACHOL) 20 MG tablet Take 1 tablet by mouth at bedtime.  07/03/2023     Allergies  Allergen Reactions   Lisinopril    Losartan    Penicillins    Sulfa Antibiotics      Past Medical History:  Diagnosis Date   ABLA  (acute blood loss anemia)    Anemia    Cancer (HCC)    BASAL CELL CARCINOMA   Depression    Diverticulitis    Dysrhythmia    GERD (gastroesophageal reflux disease)    Heme positive stool    History of shingles    Hyperlipidemia    Hypertension    Lower GI bleed    Osteoporosis    SVT (supraventricular tachycardia) (HCC)    TREATED WITH AV NODE ABLATION    Review of systems:  Otherwise negative.    Physical Exam  Gen: Alert, oriented. Appears stated age.  HEENT: [place]/AT. PERRLA. Lungs: CTA, no wheezes. CV: RR nl S1, S2. Abd: soft, benign, no masses. BS+ Ext: No edema. Pulses 2+    Planned procedures: Proceed with colonoscopy. The patient understands the nature of the planned procedure, indications, risks, alternatives and potential complications including but not limited to bleeding, infection, perforation, damage to internal organs and possible oversedation/side effects from anesthesia. The patient agrees and gives consent to proceed.  Please refer to procedure notes for findings, recommendations and patient disposition/instructions.     Jodi Allison Jodi Allison, M.D. Gastroenterology 07/05/2023  10:33 AM

## 2023-07-05 NOTE — Op Note (Signed)
 [facility] Gastroenterology Patient Name: Jodi Allison Procedure Date: 07/05/2023 11:07 AM MRN: [id number] Account #: [id number] Date of Birth: 1951/02/03 Admit Type: Outpatient Age: 73 Room: [facility] ENDO ROOM 3 Gender: Female Note Status: Supervisor Override Instrument Name: Peds Colonoscope [id number] Procedure:             Colonoscopy Indications:           Follow-up for history of adenomatous polyps in the                         colon, Abnormal CT of the GI tract, Follow-up of                         diverticulitis Providers:             Brogan Martis K. Corky Diener MD, MD Referring MD:          Huel Madison. Alva Jewels MD, MD (Referring MD) Medicines:             Propofol  per Anesthesia Complications:         No immediate complications. Estimated blood loss:                         Minimal. Procedure:             Pre-Anesthesia Assessment:                        - The risks and benefits of the procedure and the                         sedation options and risks were discussed with the                         patient. All questions were answered and informed                         consent was obtained.                        - Patient identification and proposed procedure were                         verified prior to the procedure by the nurse. The                         procedure was verified in the procedure room.                        - ASA Grade Assessment: III - A patient with severe                         systemic disease.                        - After reviewing the risks and benefits, the patient                         was deemed in satisfactory condition to undergo the  procedure.                        After obtaining informed consent, the colonoscope was                         passed under direct vision. Throughout the procedure,                         the patient's blood pressure, pulse, and oxygen                         saturations  were monitored continuously. The                         Colonoscope was introduced through the anus and                         advanced to the the cecum, identified by appendiceal                         orifice and ileocecal valve. The colonoscopy was                         technically difficult and complex due to multiple                         diverticula in the colon and bowel stenosis.                         Successful completion of the procedure was aided by                         applying abdominal pressure. The patient tolerated the                         procedure well. The quality of the bowel preparation                         was good. The ileocecal valve, appendiceal orifice,                         and rectum were photographed. Findings:      The perianal and digital rectal examinations were normal. Pertinent       negatives include normal sphincter tone and no palpable rectal lesions.      Non-bleeding internal hemorrhoids were found during retroflexion. The       hemorrhoids were Grade I (internal hemorrhoids that do not prolapse).      Multiple large-mouthed and small-mouthed diverticula were found in the       sigmoid colon, mid sigmoid colon and distal sigmoid colon. There was no       evidence of diverticular bleeding.      A benign-appearing, intrinsic moderate stenosis measuring 1 cm (in       length) was found in the mid sigmoid colon and was traversed. Biopsied       previously with benign results. No repeat biopsies performed today of       this area. Estimated blood loss was minimal.  A polypoid and ulcerated non-obstructing small mass was found in the       cecum. The mass was non-circumferential. The mass measured three cm in       length. In addition, its diameter measured twenty-one mm. No bleeding       was present. This was biopsied with a cold forceps for histology. Area       was tattooed with an injection of 4 mL of Spot (carbon black).  Estimated       blood loss was minimal.      The exam was otherwise without abnormality. Impression:            - Non-bleeding internal hemorrhoids.                        - Severe diverticulosis in the sigmoid colon, in the                         mid sigmoid colon and in the distal sigmoid colon.                         There was no evidence of diverticular bleeding.                        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 Biopsied.                         Tattooed.                        - The examination was otherwise normal. Recommendation:        - Patient has a contact number available for                         emergencies. The signs and symptoms of potential                         delayed complications were discussed with the patient.                         Return to normal activities tomorrow. Written                         discharge instructions were provided to the patient.                        - Resume previous diet.                        - Continue present medications.                        - Await pathology results.                        - Further recommendations to follow after reviewing  results                        - Return to GI office in 1 week.                        - Telephone GI office to schedule appointment in 1                         week.                        - The findings and recommendations were discussed with                         the patient. Procedure Code(s):     --- Professional ---                        [phone], Colonoscopy, flexible; with biopsy, single or                         multiple                        45381, Colonoscopy, flexible; with directed submucosal                         injection(s), any substance Diagnosis Code(s):     --- Professional ---                        R93.3, Abnormal findings on diagnostic imaging of                          other parts of digestive tract                        K57.30, Diverticulosis of large intestine without                         perforation or abscess without bleeding                        K57.32, Diverticulitis of large intestine without                         perforation or abscess without bleeding                        D49.0, Neoplasm of unspecified behavior of digestive                         system                        K56.699, Other intestinal obstruction unspecified as                         to partial versus complete obstruction                        K64.0, First degree hemorrhoids CPT copyright 2022 American Medical Association. All rights reserved.  The codes documented in this report are preliminary and upon coder review may  be revised to meet current compliance requirements. Cassie Click MD, MD 07/05/2023 11:44:29 AM This report has been signed electronically. Number of Addenda: 0 Note Initiated On: 07/05/2023 11:07 AM Scope Withdrawal Time: 0 hours 9 minutes 31 seconds  Total Procedure Duration: 0 hours 20 minutes 56 seconds  Estimated Blood Loss:  Estimated blood loss was minimal.      [facility]

## 2023-07-05 NOTE — Interval H&P Note (Signed)
 History and Physical Interval Note:  07/05/2023 10:36 AM  Tera Fellows  has presented today for surgery, with the diagnosis of hx of adenomatous polyp of colon,abnormal CT scan GI tract.  The various methods of treatment have been discussed with the patient and family. After consideration of risks, benefits and other options for treatment, the patient has consented to  Procedure(s): COLONOSCOPY (N/A) as a surgical intervention.  The patient's history has been reviewed, patient examined, no change in status, stable for surgery.  I have reviewed the patient's chart and labs.  Questions were answered to the patient's satisfaction.     [place], Sanyia Dini

## 2023-07-05 NOTE — Anesthesia Preprocedure Evaluation (Addendum)
 Anesthesia Evaluation  Patient identified by MRN, date of birth, ID band Patient awake    Reviewed: Allergy & Precautions, H&P , NPO status , Patient's Chart, lab work & pertinent test results  Airway Mallampati: II  TM Distance: >3 FB Neck ROM: full    Dental no notable dental hx.    Pulmonary neg pulmonary ROS   Pulmonary exam normal        Cardiovascular Exercise Tolerance: Good hypertension, Normal cardiovascular exam+ dysrhythmias (SVT (supraventricular tachycardia) (HCC)  TREATED WITH AV NODE ABLATION) + Valvular Problems/Murmurs AI   ECHO 10/24: moderate to severe AI  NORMAL LA PRESSURES WITH DIASTOLIC DYSFUNCTION (GRADE 1)  VALVULAR REGURGITATION: MODERATE AR, TRIVIAL MR, TRIVIAL PR, TRIVIAL TR  NO VALVULAR STENOSIS     Neuro/Psych  PSYCHIATRIC DISORDERS  Depression    negative neurological ROS     GI/Hepatic Neg liver ROS,GERD  Controlled,,  Endo/Other  negative endocrine ROS    Renal/GU negative Renal ROS  negative genitourinary   Musculoskeletal   Abdominal Normal abdominal exam  (+)   Peds  Hematology negative hematology ROS (+)   Anesthesia Other Findings Past Medical History: No date: ABLA (acute blood loss anemia) No date: Anemia No date: Cancer (HCC)     Comment:  BASAL CELL CARCINOMA No date: Depression No date: Diverticulitis No date: Dysrhythmia No date: GERD (gastroesophageal reflux disease) No date: Heme positive stool No date: History of shingles No date: Hyperlipidemia No date: Hypertension No date: Lower GI bleed No date: Osteoporosis No date: SVT (supraventricular tachycardia) (HCC)     Comment:  TREATED WITH AV NODE ABLATION  Past Surgical History: No date: ANKLE FRACTURE SURGERY 04/27/2023: BIOPSY     Comment:  Procedure: BIOPSY;  Surgeon: Toledo, Alphonsus Jeans, MD;                Location: [facility] ENDOSCOPY;  Service: Endoscopy;; 2000: CARDIAC ELECTROPHYSIOLOGY MAPPING AND  ABLATION No date: COLONOSCOPY No date: COLONOSCOPY 09/17/2019: COLONOSCOPY WITH PROPOFOL ; N/A     Comment:  Procedure: COLONOSCOPY WITH PROPOFOL ;  Surgeon: Toledo,               Alphonsus Jeans, MD;  Location: [facility] ENDOSCOPY;  Service:               Endoscopy;  Laterality: N/A; 04/27/2023: COLONOSCOPY WITH PROPOFOL ; N/A     Comment:  Procedure: COLONOSCOPY WITH PROPOFOL ;  Surgeon: Toledo,               Alphonsus Jeans, MD;  Location: [facility] ENDOSCOPY;  Service:               Endoscopy;  Laterality: N/A; No date: ESOPHAGOGASTRODUODENOSCOPY No date: EXCISION OF BASAL CELL CARCINOMA No date: FRACTURE SURGERY No date: JOINT REPLACEMENT; Left No date: TOTAL HIP ARTHROPLASTY     Reproductive/Obstetrics negative OB ROS                             Anesthesia Physical Anesthesia Plan  ASA: 2  Anesthesia Plan: General   Post-op Pain Management:    Induction: Intravenous  PONV Risk Score and Plan: Propofol  infusion and TIVA  Airway Management Planned: Natural Airway  Additional Equipment:   Intra-op Plan:   Post-operative Plan:   Informed Consent: I have reviewed the patients History and Physical, chart, labs and discussed the procedure including the risks, benefits and alternatives for the proposed anesthesia with the patient or authorized representative who has indicated his/her  understanding and acceptance.     Dental Advisory Given  Plan Discussed with: CRNA and Surgeon  Anesthesia Plan Comments:         Anesthesia Quick Evaluation

## 2023-07-05 NOTE — Anesthesia Postprocedure Evaluation (Signed)
 Anesthesia Post Note  Patient: CHANCI COPE  Procedure(s) Performed: COLONOSCOPY  Patient location during evaluation: Endoscopy Anesthesia Type: General Level of consciousness: awake and alert Pain management: pain level controlled Vital Signs Assessment: post-procedure vital signs reviewed and stable Respiratory status: spontaneous breathing, nonlabored ventilation and respiratory function stable Cardiovascular status: blood pressure returned to baseline and stable Postop Assessment: no apparent nausea or vomiting Anesthetic complications: no   No notable events documented.   Last Vitals:  Vitals:   07/05/23 1140 07/05/23 1150  BP: (!) 99/51 126/65  Pulse:    Resp:  17  Temp: (!) 36.1 C   SpO2:      Last Pain:  Vitals:   07/05/23 1150  TempSrc:   PainSc: 0-No pain                 Baltazar Bonier

## 2023-07-06 LAB — SURGICAL PATHOLOGY

## 2023-07-07 ENCOUNTER — Other Ambulatory Visit: Payer: Self-pay | Admitting: General Surgery

## 2023-07-07 ENCOUNTER — Encounter: Payer: Self-pay | Admitting: Oncology

## 2023-07-07 ENCOUNTER — Inpatient Hospital Stay

## 2023-07-07 ENCOUNTER — Inpatient Hospital Stay: Attending: Oncology | Admitting: Oncology

## 2023-07-07 VITALS — BP 140/62 | HR 84 | Temp 98.9°F | Resp 18 | Wt 137.0 lb

## 2023-07-07 DIAGNOSIS — R918 Other nonspecific abnormal finding of lung field: Secondary | ICD-10-CM | POA: Diagnosis not present

## 2023-07-07 DIAGNOSIS — C182 Malignant neoplasm of ascending colon: Secondary | ICD-10-CM

## 2023-07-07 DIAGNOSIS — C189 Malignant neoplasm of colon, unspecified: Secondary | ICD-10-CM | POA: Diagnosis not present

## 2023-07-07 DIAGNOSIS — C180 Malignant neoplasm of cecum: Secondary | ICD-10-CM | POA: Diagnosis present

## 2023-07-07 DIAGNOSIS — Z809 Family history of malignant neoplasm, unspecified: Secondary | ICD-10-CM | POA: Insufficient documentation

## 2023-07-07 DIAGNOSIS — Z800 Family history of malignant neoplasm of digestive organs: Secondary | ICD-10-CM | POA: Diagnosis not present

## 2023-07-07 DIAGNOSIS — Z8049 Family history of malignant neoplasm of other genital organs: Secondary | ICD-10-CM | POA: Diagnosis not present

## 2023-07-07 DIAGNOSIS — Z860100 Personal history of colon polyps, unspecified: Secondary | ICD-10-CM | POA: Diagnosis not present

## 2023-07-07 DIAGNOSIS — Z803 Family history of malignant neoplasm of breast: Secondary | ICD-10-CM | POA: Insufficient documentation

## 2023-07-07 NOTE — Assessment & Plan Note (Signed)
 Colonoscopy findings, biopsy results were reviewed with patient. 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 I agree with obtaining CT chest with contrast to complete staging. Baseline CEA is within normal limits. If no additional metastasis sites are found, recommend hemicolectomy. Patient has establish care with Dr. Cintron and has plan for surgery in the next 1 to 2 weeks. I plan to see patient 2 weeks after surgery to go over pathology reports and finalize adjuvant plan.

## 2023-07-07 NOTE — Progress Notes (Signed)
 Hematology/Oncology Consult Note [phone] [phone]     REFERRING PROVIDER: Sammye Cristal*    CHIEF COMPLAINTS/PURPOSE OF CONSULTATION:  Cecum adenocarcinoma  ASSESSMENT & PLAN:   Adenocarcinoma of colon [facility]) Colonoscopy findings, biopsy results were reviewed with patient. 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 I agree with obtaining CT chest with contrast to complete staging. Baseline CEA is within normal limits. If no additional metastasis sites are found, recommend hemicolectomy. Patient has establish care with Dr. Cintron and has plan for surgery in the next 1 to 2 weeks. I plan to see patient 2 weeks after surgery to go over pathology reports and finalize adjuvant plan.  Family history of cancer Recommend Dentist. Patient prefers to get testing after she finishes surgery.   No orders of the defined types were placed in this encounter.  Follow-up to be determined. All questions were answered. The patient knows to call the clinic with any problems, questions or concerns.  [name], MD, PhD [facility] Hematology Oncology 07/07/2023    HISTORY OF PRESENTING ILLNESS:  Jodi Allison 73 y.o. female presents to establish care for cecum adenocarcinoma I have reviewed her chart and materials related to her cancer extensively and collaborated history with the patient. Summary of oncologic history is as follows: Oncology History  Adenocarcinoma of colon (HCC)  07/07/2023 Initial Diagnosis   RADIOGRAPHIC STUDIES: I have personally reviewed the radiological images as listed and agreed with the findings in the report. Adenocarcinoma of colon   Patient experienced recent rectal bleeding, abdominal pain and loose stool since December.  Patient was seen at ED and CT suggest acute diverticulitis.  Patient was treated with antibiotics.  Symptoms have improved.  04/27/2023, patient underwent  colonoscopy She was found to have nonbleeding internal hemorrhoids, congested mucosa in the distal sigmoid colon.  Stricture in the distal sigmoid colon which was biopsied.  Due to stenosis and swelling, procedure was aborted Biopsy of the stricture was benign.  07/05/2023, patient underwent colonoscopy. Findings include nonbleeding internal hemorrhoids, severe diverticulosis in the sigmoid colon, no evidence of diverticular bleeding.  Stricture of the mid sigmoid colon.  Likely malignant tumor in the cecum, nonobstructing, not circumferential.  Cecal mass was biopsied.  Cecum biopsy showed invasive moderately differentiated colorectal carcinoma.   07/07/2023 Cancer Staging   Staging form: Colon and Rectum, AJCC 8th Edition - Clinical stage from 07/07/2023: Stage Unknown (cTX, cN0, cM0) - Signed by [name], MD on 07/07/2023 Stage prefix: Initial diagnosis Total positive nodes: 0    Patient was accompanied by husband to establish care. She did not has any unintentional weight, night sweats or fever.  Denies any recent rectal bleeding, abdominal pain. Family history positive for paternal grandmother with history of colon cancer, diagnosed in her 33s.  Patient has a personal history of colon polyps.  Mother with history of breast cancer.  Maternal grandmother with uterine cancer.  MEDICAL HISTORY:  Past Medical History:  Diagnosis Date   ABLA (acute blood loss anemia)    Anemia    Cancer (HCC)    BASAL CELL CARCINOMA   Depression    Diverticulitis    Dysrhythmia    GERD (gastroesophageal reflux disease)    Heme positive stool    History of shingles    Hyperlipidemia    Hypertension    Lower GI bleed    Osteoporosis    SVT (supraventricular tachycardia) (HCC)    TREATED WITH AV NODE ABLATION  SURGICAL HISTORY: Past Surgical History:  Procedure Laterality Date   ANKLE FRACTURE SURGERY     BIOPSY  04/27/2023   Procedure: BIOPSY;  Surgeon: Corky Diener, Alphonsus Jeans, MD;  Location: [facility]  ENDOSCOPY;  Service: Endoscopy;;   CARDIAC ELECTROPHYSIOLOGY MAPPING AND ABLATION  2000   COLONOSCOPY     COLONOSCOPY     COLONOSCOPY N/A 07/05/2023   Procedure: COLONOSCOPY;  Surgeon: Toledo, Alphonsus Jeans, MD;  Location: [facility] ENDOSCOPY;  Service: Gastroenterology;  Laterality: N/A;   COLONOSCOPY WITH PROPOFOL  N/A 09/17/2019   Procedure: COLONOSCOPY WITH PROPOFOL ;  Surgeon: Toledo, Alphonsus Jeans, MD;  Location: [facility] ENDOSCOPY;  Service: Endoscopy;  Laterality: N/A;   COLONOSCOPY WITH PROPOFOL  N/A 04/27/2023   Procedure: COLONOSCOPY WITH PROPOFOL ;  Surgeon: Toledo, Alphonsus Jeans, MD;  Location: [facility] ENDOSCOPY;  Service: Endoscopy;  Laterality: N/A;   ESOPHAGOGASTRODUODENOSCOPY     EXCISION OF BASAL CELL CARCINOMA     FRACTURE SURGERY     JOINT REPLACEMENT Left    TOTAL HIP ARTHROPLASTY      SOCIAL HISTORY: Social History   Socioeconomic History   Marital status: Widowed    Spouse name: Not on file   Number of children: Not on file   Years of education: Not on file   Highest education level: Not on file  Occupational History   Not on file  Tobacco Use   Smoking status: Never   Smokeless tobacco: Never  Vaping Use   Vaping status: Never Used  Substance and Sexual Activity   Alcohol use: Yes    Alcohol/week: 4.0 standard drinks of alcohol    Types: 2 Glasses of wine, 2 Standard drinks or equivalent per week    Comment: none last 24 hrs   Drug use: Never   Sexual activity: Not on file  Other Topics Concern   Not on file  Social History Narrative   Not on file   Social Drivers of Health   Financial Resource Strain: Low Risk  (07/06/2023)   Received from [facility]   Overall Financial Resource Strain (CARDIA)    Difficulty of Paying Living Expenses: Not hard at all  Food Insecurity: No Food Insecurity (07/06/2023)   Received from [facility]   Hunger Vital Sign    Worried About Running Out of Food in the Last Year: Never true    Ran Out of Food in  the Last Year: Never true  Transportation Needs: No Transportation Needs (07/06/2023)   Received from [facility] - Transportation    In the past 12 months, has lack of transportation kept you from medical appointments or from getting medications?: No    Lack of Transportation (Non-Medical): No  Physical Activity: Not on file  Stress: Not on file  Social Connections: Not on file  Intimate Partner Violence: Not At Risk (07/07/2023)   Humiliation, Afraid, Rape, and Kick questionnaire    Fear of Current or Ex-Partner: No    Emotionally Abused: No    Physically Abused: No    Sexually Abused: No    FAMILY HISTORY: Family History  Problem Relation Age of Onset   Breast cancer Mother 27   Uterine cancer Maternal Grandmother    Colon cancer Paternal Grandmother     ALLERGIES:  is allergic to lisinopril, losartan, penicillins, and sulfa antibiotics.  MEDICATIONS:  Current Outpatient Medications  Medication Sig Dispense Refill   amLODipine (NORVASC) 5 MG tablet Take 5 mg by mouth at bedtime.  escitalopram  (LEXAPRO ) 10 MG tablet Take 10 mg by mouth daily.     pravastatin (PRAVACHOL) 20 MG tablet Take 20 mg by mouth at bedtime.     Calcium Carb-Cholecalciferol (CALCIUM + VITAMIN D3 PO) Take 1 tablet by mouth 2 (two) times daily.     Multiple Vitamins-Minerals (PRESERVISION AREDS 2) CAPS Take 1 capsule by mouth 2 (two) times daily.     No current facility-administered medications for this visit.    Review of Systems  Constitutional:  Negative for appetite change, chills, fatigue and fever.  HENT:   Negative for hearing loss and voice change.   Eyes:  Negative for eye problems.  Respiratory:  Negative for chest tightness and cough.   Cardiovascular:  Negative for chest pain.  Gastrointestinal:  Negative for abdominal distention, abdominal pain and blood in stool.  Endocrine: Negative for hot flashes.  Genitourinary:  Negative for difficulty urinating and  frequency.   Musculoskeletal:  Negative for arthralgias.  Skin:  Negative for itching and rash.  Neurological:  Negative for extremity weakness.  Hematological:  Negative for adenopathy.  Psychiatric/Behavioral:  Negative for confusion.      PHYSICAL EXAMINATION: ECOG PERFORMANCE STATUS: 0 - Asymptomatic  Vitals:   07/07/23 0922  BP: (!) 140/62  Pulse: 84  Resp: 18  Temp: 98.9 F (37.2 C)  SpO2: 99%   Filed Weights   07/07/23 0922  Weight: 137 lb (62.1 kg)    Physical Exam Constitutional:      General: She is not in acute distress.    Appearance: She is not diaphoretic.  HENT:     Head: Normocephalic and atraumatic.  Eyes:     General: No scleral icterus. Cardiovascular:     Rate and Rhythm: Normal rate and regular rhythm.     Heart sounds: No murmur heard. Pulmonary:     Effort: Pulmonary effort is normal. No respiratory distress.     Breath sounds: No wheezing.  Abdominal:     General: There is no distension.     Palpations: Abdomen is soft.     Tenderness: There is no abdominal tenderness.  Musculoskeletal:        General: Normal range of motion.     Cervical back: Normal range of motion and neck supple.  Skin:    General: Skin is warm and dry.     Findings: No erythema.  Neurological:     Mental Status: She is alert and oriented to person, place, and time. Mental status is at baseline.     Cranial Nerves: No cranial nerve deficit.     Motor: No abnormal muscle tone.  Psychiatric:        Mood and Affect: Affect normal.      LABORATORY DATA:  I have reviewed the data as listed    Latest Ref Rng & Units 02/27/2023    5:42 AM 02/26/2023   10:49 PM 02/26/2023    2:20 PM  CBC  WBC 4.0 - 10.5 K/uL 5.8     Hemoglobin 12.0 - 15.0 g/dL 45.4  09.8  11.9   Hematocrit 36.0 - 46.0 % 32.4  34.0  33.8   Platelets 150 - 400 K/uL 273         Latest Ref Rng & Units 02/26/2023    1:10 AM 03/23/2013    5:12 AM 03/22/2013    5:07 AM  CMP  Glucose 70 - 99  mg/dL 96  99  147   BUN 8 - 23 mg/dL 26  10  15   Creatinine 0.44 - 1.00 mg/dL 1.61  0.96  0.45   Sodium 135 - 145 mmol/L 139  137  136   Potassium 3.5 - 5.1 mmol/L 3.6  3.5  4.4   Chloride 98 - 111 mmol/L 105  105  104   CO2 22 - 32 mmol/L 25  28  29    Calcium 8.9 - 10.3 mg/dL 8.8  8.2  8.1   Total Protein 6.5 - 8.1 g/dL 7.9     Total Bilirubin <1.2 mg/dL 0.3     Alkaline Phos 38 - 126 U/L 78     AST 15 - 41 U/L 19     ALT 0 - 44 U/L 15        RADIOGRAPHIC STUDIES: I have personally reviewed the radiological images as listed and agreed with the findings in the report. No results found.

## 2023-07-07 NOTE — Assessment & Plan Note (Signed)
 Recommend Dentist. Patient prefers to get testing after she finishes surgery.

## 2023-07-07 NOTE — Progress Notes (Signed)
 Met with Mrs. Jodi Allison. Provided nurse navigator information. Surgery has been scheduled for Jul 20, 2023. Dr Wilhelmenia Harada will see her 2 weeks following surgery.

## 2023-07-12 ENCOUNTER — Ambulatory Visit: Payer: Self-pay | Admitting: General Surgery

## 2023-07-12 ENCOUNTER — Ambulatory Visit
Admission: RE | Admit: 2023-07-12 | Discharge: 2023-07-12 | Disposition: A | Discharge status: Home / Self Care | Source: Ambulatory Visit | Attending: General Surgery | Admitting: General Surgery

## 2023-07-12 DIAGNOSIS — C182 Malignant neoplasm of ascending colon: Secondary | ICD-10-CM | POA: Diagnosis present

## 2023-07-12 MED ORDER — IOHEXOL 300 MG/ML  SOLN
100.0000 mL | Freq: Once | INTRAMUSCULAR | Status: AC | PRN
Start: 2023-07-12 — End: 2023-07-12
  Administered 2023-07-12: 100 mL via INTRAVENOUS

## 2023-07-12 NOTE — H&P (Signed)
 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  She recently underwent a colonoscopy that revealed a polypoid ulcerated nonobstructive small mass in the cecum. A biopsy confirmed the presence of invasive adenocarcinoma. No significant symptoms of blood in the stool are reported, although she initially attributed her symptoms to diverticulitis.  Her past medical history includes chronic diverticulitis, which has been a persistent issue. A previous colonoscopy showed a stricture, thought to be related to her diverticulitis, which had previously prevented a complete examination. Recent inflammation subsided enough to allow a full colonoscopy.  She has a significant family history of colon cancer, with her paternal grandmother having died from the disease at the age of 24. Due to this family history, she began regular colonoscopies at the age of 3 and has had them every five years since. Her last colonoscopy in 2021 was completed without any notable findings.  She has a history of liver cysts, which have been stable over multiple scans and are not currently causing concern. A recent CT scan of her abdomen did not show any major abnormalities, but further imaging is planned to assess for potential spread of the cancer.   PAST MEDICAL HISTORY:  Past Medical History:  Diagnosis Date  Heme positive stool 03/28/2014  History of bone density study 10/22/2008  Hypertension  Osteoporosis  Shingles  SVT (supraventricular tachycardia) (CMS/HHS-HCC) 2000  treated with AV node ablation     PAST SURGICAL HISTORY:  Past Surgical History:  Procedure Laterality Date  Cardiac ablation 2000  [facility]  COLONOSCOPY 06/16/2001  Adenomatous Polyp  EGD 06/16/2001  FRACTURE SURGERY 2005  ORIF ANKLE FRACTURE  Left total hip arthroplasty 03/21/2013  Dr. Aubry Blase  EGD 05/06/2014   COLONOSCOPY 05/06/2014  PH Adenomatous Polyp, FH Colon Polyps (Father): CBF 04/2019  COLONOSCOPY 09/17/2019  Diverticulosis/Otherwise normal colon/PHx CP/Repeat 75yrs/TKT  Colon @ [facility] 04/27/2023  Active chronic colitis/Repeat 24months/TKT  COLONOSCOPY 03/10/2010, 11/24/2004  PH Adenomatous Polyp: CBF 03/2015  Excision of basal cell carcinoma  UPPER GASTROINTESTINAL ENDOSCOPY    MEDICATIONS:  Outpatient Encounter Medications as of 07/06/2023  Medication Sig Dispense Refill  albuterol MDI, PROVENTIL, VENTOLIN, PROAIR, HFA 90 mcg/actuation inhaler Inhale 2 inhalations into the lungs every 4 (four) hours as needed for Wheezing 1 each 4  amLODIPine (NORVASC) 5 MG tablet Take 1 tablet (5 mg total) by mouth once daily 90 tablet 1  calcium carbonate-vitamin D3 (CALTRATE 600+D) 600 mg(1,500mg ) -200 unit tablet Take 2 tablets by mouth once daily.  clindamycin  (CLEOCIN ) 300 MG capsule TAKE TWO CAPSULES BY MOUTH ONCE FOR ONE DOSE. TAKE ONE HOUR BEFORE TAKE 4 CAPSULES 1 HOUR BEFORE DENTAL APPOINTMENT APPT. 2 capsule 3  diphenhydramine  HCl (BENADRYL  ORAL) Take 25 mg by mouth once daily as needed  EPINEPHrine (EPIPEN) 0.3 mg/0.3 mL pen injector 9  escitalopram  oxalate (LEXAPRO ) 10 MG tablet Take 1 tablet (10 mg total) by mouth once daily 90 tablet 1  estradioL (ESTRACE) 0.01 % (0.1 mg/gram) vaginal cream Place 0.5 g vaginally twice a week 42.5 g 3  pravastatin (PRAVACHOL) 20 MG tablet Take 1 tablet (20 mg total) by mouth at bedtime 90 tablet 1  promethazine-dextromethorphan (PROMETHAZINE-DM) 6.25-15 mg/5 mL syrup Take 5 mLs by mouth every 6 (six) hours as needed 120 mL 0  sodium, potassium, and magnesium (SUPREP) oral solution Take 1 Bottle by mouth as directed One kit contains 2 bottles. Take both bottles at  the times instructed by your provider. 354 mL 0  sodium, potassium, and magnesium (SUPREP) oral solution Take 1 Bottle by mouth as directed One kit contains 2 bottles. Take both bottles at the times  instructed by your provider. 354 mL 0  vit A/vit C/vit E/zinc/copper (PRESERVISION AREDS ORAL) Take 1 caplet by mouth once daily  metroNIDAZOLE  (FLAGYL ) 500 MG tablet Take 2 tablets at 2 pm, 3 pm and 10 pm the day before surgery. 6 tablet 0  neomycin 500 mg tablet Take 2 tablets at 2 pm, 3 pm and 10 pm the day before surgery. 6 tablet 0   No facility-administered encounter medications on file as of 07/06/2023.    ALLERGIES:  Lisinopril, Losartan, Penicillin v potassium, and Sulfa (sulfonamide antibiotics)  SOCIAL HISTORY:  Social History   Socioeconomic History  Marital status: Widowed  Spouse name: Arvel Lather  Number of children: 0  Years of education: 17  Occupational History  Occupation: Retired-Pharmacist  Tobacco Use  Smoking status: Never  Smokeless tobacco: Never  Vaping Use  Vaping status: Never Used  Substance and Sexual Activity  Alcohol use: Yes  Alcohol/week: 2.0 standard drinks of alcohol  Types: 2 Glasses of wine per week  Comment: 1-2 glasses a week  Drug use: No  Sexual activity: Yes  Partners: Male  Social History Narrative  ** Merged History Encounter **    Social Drivers of Health   Financial Resource Strain: Low Risk (07/06/2023)  Overall Financial Resource Strain (CARDIA)  Difficulty of Paying Living Expenses: Not hard at all  Food Insecurity: No Food Insecurity (07/06/2023)  Hunger Vital Sign  Worried About Running Out of Food in the Last Year: Never true  Ran Out of Food in the Last Year: Never true  Transportation Needs: No Transportation Needs (07/06/2023)  PRAPARE - Risk analyst (Medical): No  Lack of Transportation (Non-Medical): No   FAMILY HISTORY:  Family History  Problem Relation Name Age of Onset  High blood pressure (Hypertension) Mother  Breast cancer Mother  Asthma Mother  High blood pressure (Hypertension) Father  Colon polyps Father  Uterine cancer Maternal Grandmother  High blood pressure  (Hypertension) Sister  Colon cancer Paternal Grandmother    GENERAL REVIEW OF SYSTEMS:   General ROS: negative for - chills, fatigue, fever, weight gain or weight loss Allergy and Immunology ROS: negative for - hives  Hematological and Lymphatic ROS: negative for - bleeding problems or bruising, negative for palpable nodes Endocrine ROS: negative for - heat or cold intolerance, hair changes Respiratory ROS: negative for - cough, shortness of breath or wheezing Cardiovascular ROS: no chest pain or palpitations GI ROS: negative for nausea, vomiting, abdominal pain, diarrhea, constipation Musculoskeletal ROS: negative for - joint swelling or muscle pain Neurological ROS: negative for - confusion, syncope Dermatological ROS: negative for pruritus and rash  PHYSICAL EXAM:  Vitals:  07/06/23 1122  BP: 138/65  Pulse: 91  .  Ht:160 cm (5\' 3" ) Wt:61.7 kg (136 lb) IHK:VQQV surface area is 1.66 meters squared. Body mass index is 24.09 kg/m.Aaron Aas  GENERAL: Alert, active, oriented x3  HEENT: Pupils equal reactive to light. Extraocular movements are intact. Sclera clear. Palpebral conjunctiva normal red color.Pharynx clear.  NECK: Supple with no palpable mass and no adenopathy.  LUNGS: Sound clear with no rales rhonchi or wheezes.  HEART: Regular rhythm S1 and S2 without murmur.  ABDOMEN: Soft and depressible, nontender with no palpable mass, no hepatomegaly.   EXTREMITIES: Well-developed well-nourished symmetrical with no dependent edema.  NEUROLOGICAL: Awake alert oriented, facial expression symmetrical, moving all extremities.  Results RADIOLOGY CT scan: Multiple cysts in the liver; no major abnormalities in the abdomen  DIAGNOSTIC Colonoscopy: Polypoid ulcerated nonobstructive small mass in the cecum (07/05/2023)  PATHOLOGY Biopsy: Invasive moderately differentiated colorectal adenocarcinoma (07/05/2023)  Assessment & Plan Invasive adenocarcinoma of the cecum  Newly diagnosed  invasive moderately differentiated colorectal carcinoma of the cecum confirmed by biopsy. The mass is a polypoid ulcerated nonobstructive small mass. A recent CT scan of the abdomen showed no major findings, but further staging is required to assess for metastasis. The liver has multiple cysts without concerning features. The cancer can potentially spread to the liver and lymph nodes. Surgical intervention is necessary, and a laparoscopic right colectomy is planned. Risks include anastomotic leak, injury to surrounding structures such as the kidneys, uterus, small intestine, and liver, and potential for bowel obstruction. Recovery is expected to take 2-4 weeks, with a hospital stay of 2-3 days. The goal is to ensure pain control, ambulation, and return of bowel function post-operatively. Order CT scan of the chest and pelvis to assess for metastasis. Order tumor marker labs to guide management. Refer to oncology for further evaluation and management. Schedule laparoscopic right colectomy. Discuss surgical risks and recovery expectations with her.  Diverticulitis  Chronic diverticulitis with previous stricture noted on colonoscopy. The current colonoscopy was performed to evaluate diverticulitis, which is not currently requiring intervention.  Malignant neoplasm of ascending colon (CMS/HHS-HCC) [C18.2]   Patient and her boyfriend verbalized understanding, all questions were answered, and were agreeable with the plan outlined above.   [name], MD

## 2023-07-12 NOTE — H&P (View-Only) (Signed)
 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  She recently underwent a colonoscopy that revealed a polypoid ulcerated nonobstructive small mass in the cecum. A biopsy confirmed the presence of invasive adenocarcinoma. No significant symptoms of blood in the stool are reported, although she initially attributed her symptoms to diverticulitis.  Her past medical history includes chronic diverticulitis, which has been a persistent issue. A previous colonoscopy showed a stricture, thought to be related to her diverticulitis, which had previously prevented a complete examination. Recent inflammation subsided enough to allow a full colonoscopy.  She has a significant family history of colon cancer, with her paternal grandmother having died from the disease at the age of 24. Due to this family history, she began regular colonoscopies at the age of 3 and has had them every five years since. Her last colonoscopy in 2021 was completed without any notable findings.  She has a history of liver cysts, which have been stable over multiple scans and are not currently causing concern. A recent CT scan of her abdomen did not show any major abnormalities, but further imaging is planned to assess for potential spread of the cancer.   PAST MEDICAL HISTORY:  Past Medical History:  Diagnosis Date  Heme positive stool 03/28/2014  History of bone density study 10/22/2008  Hypertension  Osteoporosis  Shingles  SVT (supraventricular tachycardia) (CMS/HHS-HCC) 2000  treated with AV node ablation     PAST SURGICAL HISTORY:  Past Surgical History:  Procedure Laterality Date  Cardiac ablation 2000  [facility]  COLONOSCOPY 06/16/2001  Adenomatous Polyp  EGD 06/16/2001  FRACTURE SURGERY 2005  ORIF ANKLE FRACTURE  Left total hip arthroplasty 03/21/2013  Dr. Aubry Blase  EGD 05/06/2014   COLONOSCOPY 05/06/2014  PH Adenomatous Polyp, FH Colon Polyps (Father): CBF 04/2019  COLONOSCOPY 09/17/2019  Diverticulosis/Otherwise normal colon/PHx CP/Repeat 75yrs/TKT  Colon @ [facility] 04/27/2023  Active chronic colitis/Repeat 24months/TKT  COLONOSCOPY 03/10/2010, 11/24/2004  PH Adenomatous Polyp: CBF 03/2015  Excision of basal cell carcinoma  UPPER GASTROINTESTINAL ENDOSCOPY    MEDICATIONS:  Outpatient Encounter Medications as of 07/06/2023  Medication Sig Dispense Refill  albuterol MDI, PROVENTIL, VENTOLIN, PROAIR, HFA 90 mcg/actuation inhaler Inhale 2 inhalations into the lungs every 4 (four) hours as needed for Wheezing 1 each 4  amLODIPine (NORVASC) 5 MG tablet Take 1 tablet (5 mg total) by mouth once daily 90 tablet 1  calcium carbonate-vitamin D3 (CALTRATE 600+D) 600 mg(1,500mg ) -200 unit tablet Take 2 tablets by mouth once daily.  clindamycin  (CLEOCIN ) 300 MG capsule TAKE TWO CAPSULES BY MOUTH ONCE FOR ONE DOSE. TAKE ONE HOUR BEFORE TAKE 4 CAPSULES 1 HOUR BEFORE DENTAL APPOINTMENT APPT. 2 capsule 3  diphenhydramine  HCl (BENADRYL  ORAL) Take 25 mg by mouth once daily as needed  EPINEPHrine (EPIPEN) 0.3 mg/0.3 mL pen injector 9  escitalopram  oxalate (LEXAPRO ) 10 MG tablet Take 1 tablet (10 mg total) by mouth once daily 90 tablet 1  estradioL (ESTRACE) 0.01 % (0.1 mg/gram) vaginal cream Place 0.5 g vaginally twice a week 42.5 g 3  pravastatin (PRAVACHOL) 20 MG tablet Take 1 tablet (20 mg total) by mouth at bedtime 90 tablet 1  promethazine-dextromethorphan (PROMETHAZINE-DM) 6.25-15 mg/5 mL syrup Take 5 mLs by mouth every 6 (six) hours as needed 120 mL 0  sodium, potassium, and magnesium (SUPREP) oral solution Take 1 Bottle by mouth as directed One kit contains 2 bottles. Take both bottles at  the times instructed by your provider. 354 mL 0  sodium, potassium, and magnesium (SUPREP) oral solution Take 1 Bottle by mouth as directed One kit contains 2 bottles. Take both bottles at the times  instructed by your provider. 354 mL 0  vit A/vit C/vit E/zinc/copper (PRESERVISION AREDS ORAL) Take 1 caplet by mouth once daily  metroNIDAZOLE  (FLAGYL ) 500 MG tablet Take 2 tablets at 2 pm, 3 pm and 10 pm the day before surgery. 6 tablet 0  neomycin 500 mg tablet Take 2 tablets at 2 pm, 3 pm and 10 pm the day before surgery. 6 tablet 0   No facility-administered encounter medications on file as of 07/06/2023.    ALLERGIES:  Lisinopril, Losartan, Penicillin v potassium, and Sulfa (sulfonamide antibiotics)  SOCIAL HISTORY:  Social History   Socioeconomic History  Marital status: Widowed  Spouse name: Arvel Lather  Number of children: 0  Years of education: 17  Occupational History  Occupation: Retired-Pharmacist  Tobacco Use  Smoking status: Never  Smokeless tobacco: Never  Vaping Use  Vaping status: Never Used  Substance and Sexual Activity  Alcohol use: Yes  Alcohol/week: 2.0 standard drinks of alcohol  Types: 2 Glasses of wine per week  Comment: 1-2 glasses a week  Drug use: No  Sexual activity: Yes  Partners: Male  Social History Narrative  ** Merged History Encounter **    Social Drivers of Health   Financial Resource Strain: Low Risk (07/06/2023)  Overall Financial Resource Strain (CARDIA)  Difficulty of Paying Living Expenses: Not hard at all  Food Insecurity: No Food Insecurity (07/06/2023)  Hunger Vital Sign  Worried About Running Out of Food in the Last Year: Never true  Ran Out of Food in the Last Year: Never true  Transportation Needs: No Transportation Needs (07/06/2023)  PRAPARE - Risk analyst (Medical): No  Lack of Transportation (Non-Medical): No   FAMILY HISTORY:  Family History  Problem Relation Name Age of Onset  High blood pressure (Hypertension) Mother  Breast cancer Mother  Asthma Mother  High blood pressure (Hypertension) Father  Colon polyps Father  Uterine cancer Maternal Grandmother  High blood pressure  (Hypertension) Sister  Colon cancer Paternal Grandmother    GENERAL REVIEW OF SYSTEMS:   General ROS: negative for - chills, fatigue, fever, weight gain or weight loss Allergy and Immunology ROS: negative for - hives  Hematological and Lymphatic ROS: negative for - bleeding problems or bruising, negative for palpable nodes Endocrine ROS: negative for - heat or cold intolerance, hair changes Respiratory ROS: negative for - cough, shortness of breath or wheezing Cardiovascular ROS: no chest pain or palpitations GI ROS: negative for nausea, vomiting, abdominal pain, diarrhea, constipation Musculoskeletal ROS: negative for - joint swelling or muscle pain Neurological ROS: negative for - confusion, syncope Dermatological ROS: negative for pruritus and rash  PHYSICAL EXAM:  Vitals:  07/06/23 1122  BP: 138/65  Pulse: 91  .  Ht:160 cm (5\' 3" ) Wt:61.7 kg (136 lb) IHK:VQQV surface area is 1.66 meters squared. Body mass index is 24.09 kg/m.Aaron Aas  GENERAL: Alert, active, oriented x3  HEENT: Pupils equal reactive to light. Extraocular movements are intact. Sclera clear. Palpebral conjunctiva normal red color.Pharynx clear.  NECK: Supple with no palpable mass and no adenopathy.  LUNGS: Sound clear with no rales rhonchi or wheezes.  HEART: Regular rhythm S1 and S2 without murmur.  ABDOMEN: Soft and depressible, nontender with no palpable mass, no hepatomegaly.   EXTREMITIES: Well-developed well-nourished symmetrical with no dependent edema.  NEUROLOGICAL: Awake alert oriented, facial expression symmetrical, moving all extremities.  Results RADIOLOGY CT scan: Multiple cysts in the liver; no major abnormalities in the abdomen  DIAGNOSTIC Colonoscopy: Polypoid ulcerated nonobstructive small mass in the cecum (07/05/2023)  PATHOLOGY Biopsy: Invasive moderately differentiated colorectal adenocarcinoma (07/05/2023)  Assessment & Plan Invasive adenocarcinoma of the cecum  Newly diagnosed  invasive moderately differentiated colorectal carcinoma of the cecum confirmed by biopsy. The mass is a polypoid ulcerated nonobstructive small mass. A recent CT scan of the abdomen showed no major findings, but further staging is required to assess for metastasis. The liver has multiple cysts without concerning features. The cancer can potentially spread to the liver and lymph nodes. Surgical intervention is necessary, and a laparoscopic right colectomy is planned. Risks include anastomotic leak, injury to surrounding structures such as the kidneys, uterus, small intestine, and liver, and potential for bowel obstruction. Recovery is expected to take 2-4 weeks, with a hospital stay of 2-3 days. The goal is to ensure pain control, ambulation, and return of bowel function post-operatively. Order CT scan of the chest and pelvis to assess for metastasis. Order tumor marker labs to guide management. Refer to oncology for further evaluation and management. Schedule laparoscopic right colectomy. Discuss surgical risks and recovery expectations with her.  Diverticulitis  Chronic diverticulitis with previous stricture noted on colonoscopy. The current colonoscopy was performed to evaluate diverticulitis, which is not currently requiring intervention.  Malignant neoplasm of ascending colon (CMS/HHS-HCC) [C18.2]   Patient and her boyfriend verbalized understanding, all questions were answered, and were agreeable with the plan outlined above.   [name], MD

## 2023-07-13 ENCOUNTER — Other Ambulatory Visit: Payer: Self-pay

## 2023-07-13 ENCOUNTER — Encounter
Admission: RE | Admit: 2023-07-13 | Discharge: 2023-07-13 | Disposition: A | Discharge status: Home / Self Care | Source: Ambulatory Visit | Attending: General Surgery | Admitting: General Surgery

## 2023-07-13 VITALS — Ht 64.0 in | Wt 136.0 lb

## 2023-07-13 DIAGNOSIS — Z01810 Encounter for preprocedural cardiovascular examination: Secondary | ICD-10-CM

## 2023-07-13 DIAGNOSIS — I10 Essential (primary) hypertension: Secondary | ICD-10-CM

## 2023-07-13 DIAGNOSIS — Z01812 Encounter for preprocedural laboratory examination: Secondary | ICD-10-CM

## 2023-07-13 DIAGNOSIS — D62 Acute posthemorrhagic anemia: Secondary | ICD-10-CM

## 2023-07-13 NOTE — Patient Instructions (Addendum)
 Your procedure is scheduled on:  [facility] MAY 14  Report to the Registration Desk on the 1st floor of the [facility]. To find out your arrival time, please call [phone] between 1PM - 3PM on:  TUESDAY MAY 13  If your arrival time is 6:00 am, do not arrive before that time as the [facility] entrance doors do not open until 6:00 am.  REMEMBER: Instructions that are not followed completely may result in serious medical risk, up to and including death; or upon the discretion of your surgeon and anesthesiologist your surgery may need to be rescheduled.  Do not eat food after midnight the night before surgery.  No gum chewing or hard candies.   One week prior to surgery:  STARTING WEDNESDAY MAY 7  Stop Anti-inflammatories (NSAIDS) such as Advil, Aleve, Ibuprofen, Motrin, Naproxen, Naprosyn and Aspirin based products such as Excedrin, Goody's Powder, BC Powder. Stop ANY OVER THE COUNTER supplements until after surgery. Calcium Carb-Cholecalciferol (CALCIUM + VITAMIN D3 PO)  Multiple Vitamins-Minerals (PRESERVISION AREDS 2)   You may however, continue to take Tylenol  if needed for pain up until the day of surgery.  Continue taking all of your other prescription medications up until the day of surgery.  ON THE DAY OF SURGERY ONLY TAKE THESE MEDICATIONS WITH SIPS OF WATER:  escitalopram  (LEXAPRO )   No Alcohol for 24 hours before or after surgery.  No Smoking including e-cigarettes for 24 hours before surgery.  No chewable tobacco products for at least 6 hours before surgery.  No nicotine patches on the day of surgery.  Do not use any "recreational" drugs for at least a week (preferably 2 weeks) before your surgery.  Please be advised that the combination of cocaine and anesthesia may have negative outcomes, up to and including death. If you test positive for cocaine, your surgery will be cancelled.  On the morning of surgery brush your teeth with toothpaste and water, you may  rinse your mouth with mouthwash if you wish. Do not swallow any toothpaste or mouthwash.  Use CHG Soap as directed on instruction sheet.  Do not wear jewelry, make-up, hairpins, clips or nail polish.  For welded (permanent) jewelry: bracelets, anklets, waist bands, etc.  Please have this removed prior to surgery.  If it is not removed, there is a chance that hospital personnel will need to cut it off on the day of surgery.  Do not wear lotions, powders, or perfumes.   Do not shave body hair from the neck down 48 hours before surgery.  Contact lenses, hearing aids and dentures may not be worn into surgery.  Do not bring valuables to the hospital. [facility] is not responsible for any missing/lost belongings or valuables.   Notify your doctor if there is any change in your medical condition (cold, fever, infection).  Wear comfortable clothing (specific to your surgery type) to the hospital.  After surgery, you can help prevent lung complications by doing breathing exercises.  Take deep breaths and cough every 1-2 hours.   When coughing or sneezing, hold a pillow firmly against your incision with both hands. This is called "splinting." Doing this helps protect your incision. It also decreases belly discomfort.  If you are being admitted to the hospital overnight, leave your suitcase in the car. After surgery it may be brought to your room.  In case of increased patient census, it may be necessary for you, the patient, to continue your postoperative care in the Same Day Surgery  department.  If you are being discharged the day of surgery, you will not be allowed to drive home. You will need a responsible individual to drive you home and stay with you for 24 hours after surgery.   If you are taking public transportation, you will need to have a responsible individual with you.  Please call the Pre-admissions Testing Dept. at [phone] if you have any questions about these  instructions.  Surgery Visitation Policy:  Patients having surgery or a procedure may have two visitors.  Children under the age of 41 must have an adult with them who is not the patient.  Inpatient Visitation:    Visiting hours are 7 a.m. to 8 p.m. Up to four visitors are allowed at one time in a patient room. The visitors may rotate out with other people during the day.  One visitor age 69 or older may stay with the patient overnight and must be in the room by 8 p.m.        Preparing for Surgery with CHLORHEXIDINE GLUCONATE (CHG) Soap  Chlorhexidine Gluconate (CHG) Soap  o An antiseptic cleaner that kills germs and bonds with the skin to continue killing germs even after washing  o Used for showering the night before surgery and morning of surgery  Before surgery, you can play an important role by reducing the number of germs on your skin.  CHG (Chlorhexidine gluconate) soap is an antiseptic cleanser which kills germs and bonds with the skin to continue killing germs even after washing.  Please do not use if you have an allergy to CHG or antibacterial soaps. If your skin becomes reddened/irritated stop using the CHG.  1. Shower the NIGHT BEFORE SURGERY and the MORNING OF SURGERY with CHG soap.  2. If you choose to wash your hair, wash your hair first as usual with your normal shampoo.  3. After shampooing, rinse your hair and body thoroughly to remove the shampoo.  4. Use CHG as you would any other liquid soap. You can apply CHG directly to the skin and wash gently with a scrungie or a clean washcloth.  5. Apply the CHG soap to your body only from the neck down. Do not use on open wounds or open sores. Avoid contact with your eyes, ears, mouth, and genitals (private parts). Wash face and genitals (private parts) with your normal soap.  6. Wash thoroughly, paying special attention to the area where your surgery will be performed.  7. Thoroughly rinse your body with warm  water.  8. Do not shower/wash with your normal soap after using and rinsing off the CHG soap.  9. Pat yourself dry with a clean towel.  10. Wear clean pajamas to bed the night before surgery.  12. Place clean sheets on your bed the night of your first shower and do not sleep with pets.  13. Shower again with the CHG soap on the day of surgery prior to arriving at the hospital.  14. Do not apply any deodorants/lotions/powders.  15. Please wear clean clothes to the hospital.

## 2023-07-14 ENCOUNTER — Encounter
Admission: RE | Admit: 2023-07-14 | Discharge: 2023-07-14 | Disposition: A | Discharge status: Home / Self Care | Source: Ambulatory Visit | Attending: General Surgery | Admitting: General Surgery

## 2023-07-14 DIAGNOSIS — D62 Acute posthemorrhagic anemia: Secondary | ICD-10-CM | POA: Diagnosis not present

## 2023-07-14 DIAGNOSIS — Z01818 Encounter for other preprocedural examination: Secondary | ICD-10-CM | POA: Insufficient documentation

## 2023-07-14 DIAGNOSIS — I493 Ventricular premature depolarization: Secondary | ICD-10-CM | POA: Diagnosis not present

## 2023-07-14 DIAGNOSIS — I10 Essential (primary) hypertension: Secondary | ICD-10-CM | POA: Insufficient documentation

## 2023-07-14 DIAGNOSIS — Z01812 Encounter for preprocedural laboratory examination: Secondary | ICD-10-CM

## 2023-07-14 DIAGNOSIS — Z01810 Encounter for preprocedural cardiovascular examination: Secondary | ICD-10-CM

## 2023-07-14 LAB — CBC
HCT: 32 % — ABNORMAL LOW (ref 36.0–46.0)
Hemoglobin: 10.2 g/dL — ABNORMAL LOW (ref 12.0–15.0)
MCH: 26.2 pg (ref 26.0–34.0)
MCHC: 31.9 g/dL (ref 30.0–36.0)
MCV: 82.1 fL (ref 80.0–100.0)
Platelets: 308 10*3/uL (ref 150–400)
RBC: 3.9 MIL/uL (ref 3.87–5.11)
RDW: 15.3 % (ref 11.5–15.5)
WBC: 6.8 10*3/uL (ref 4.0–10.5)
nRBC: 0 % (ref 0.0–0.2)

## 2023-07-20 ENCOUNTER — Inpatient Hospital Stay: Payer: Self-pay | Admitting: Urgent Care

## 2023-07-20 ENCOUNTER — Encounter
Admission: RE | Disposition: A | Discharge status: Home / Self Care | Payer: Self-pay | Source: Ambulatory Visit | Attending: General Surgery

## 2023-07-20 ENCOUNTER — Inpatient Hospital Stay: Admitting: Registered Nurse

## 2023-07-20 ENCOUNTER — Encounter: Payer: Self-pay | Admitting: General Surgery

## 2023-07-20 ENCOUNTER — Inpatient Hospital Stay
Admission: RE | Admit: 2023-07-20 | Discharge: 2023-07-22 | DRG: 330 | Disposition: A | Discharge status: Home / Self Care | Source: Ambulatory Visit | Attending: General Surgery | Admitting: General Surgery

## 2023-07-20 ENCOUNTER — Other Ambulatory Visit: Payer: Self-pay

## 2023-07-20 DIAGNOSIS — Z880 Allergy status to penicillin: Secondary | ICD-10-CM | POA: Diagnosis not present

## 2023-07-20 DIAGNOSIS — Z888 Allergy status to other drugs, medicaments and biological substances status: Secondary | ICD-10-CM | POA: Diagnosis not present

## 2023-07-20 DIAGNOSIS — Z85828 Personal history of other malignant neoplasm of skin: Secondary | ICD-10-CM | POA: Diagnosis not present

## 2023-07-20 DIAGNOSIS — Z83719 Family history of colon polyps, unspecified: Secondary | ICD-10-CM | POA: Diagnosis not present

## 2023-07-20 DIAGNOSIS — K5732 Diverticulitis of large intestine without perforation or abscess without bleeding: Secondary | ICD-10-CM | POA: Diagnosis present

## 2023-07-20 DIAGNOSIS — Z79899 Other long term (current) drug therapy: Secondary | ICD-10-CM | POA: Diagnosis not present

## 2023-07-20 DIAGNOSIS — Z860101 Personal history of adenomatous and serrated colon polyps: Secondary | ICD-10-CM

## 2023-07-20 DIAGNOSIS — Z882 Allergy status to sulfonamides status: Secondary | ICD-10-CM | POA: Diagnosis not present

## 2023-07-20 DIAGNOSIS — I10 Essential (primary) hypertension: Secondary | ICD-10-CM | POA: Diagnosis present

## 2023-07-20 DIAGNOSIS — Z8249 Family history of ischemic heart disease and other diseases of the circulatory system: Secondary | ICD-10-CM | POA: Diagnosis not present

## 2023-07-20 DIAGNOSIS — E785 Hyperlipidemia, unspecified: Secondary | ICD-10-CM | POA: Diagnosis present

## 2023-07-20 DIAGNOSIS — Z800 Family history of malignant neoplasm of digestive organs: Secondary | ICD-10-CM | POA: Diagnosis not present

## 2023-07-20 DIAGNOSIS — Z79890 Hormone replacement therapy (postmenopausal): Secondary | ICD-10-CM | POA: Diagnosis not present

## 2023-07-20 DIAGNOSIS — Z96642 Presence of left artificial hip joint: Secondary | ICD-10-CM | POA: Diagnosis present

## 2023-07-20 DIAGNOSIS — C182 Malignant neoplasm of ascending colon: Secondary | ICD-10-CM | POA: Diagnosis present

## 2023-07-20 DIAGNOSIS — C189 Malignant neoplasm of colon, unspecified: Principal | ICD-10-CM | POA: Diagnosis present

## 2023-07-20 LAB — TYPE AND SCREEN
ABO/RH(D): O POS
Antibody Screen: NEGATIVE

## 2023-07-20 SURGERY — COLECTOMY, PARTIAL, ROBOT-ASSISTED, LAPAROSCOPIC
Anesthesia: General | Site: Abdomen

## 2023-07-20 MED ORDER — CHLORHEXIDINE GLUCONATE CLOTH 2 % EX PADS
6.0000 | MEDICATED_PAD | Freq: Every day | CUTANEOUS | Status: DC
  Administered 2023-07-20: 6 via TOPICAL

## 2023-07-20 MED ORDER — CELECOXIB 200 MG PO CAPS
200.0000 mg | ORAL_CAPSULE | Freq: Two times a day (BID) | ORAL | Status: DC
  Administered 2023-07-20 – 2023-07-22 (×4): 200 mg via ORAL
  Filled 2023-07-20 (×4): qty 1

## 2023-07-20 MED ORDER — ESCITALOPRAM OXALATE 10 MG PO TABS
10.0000 mg | ORAL_TABLET | Freq: Every day | ORAL | Status: DC
  Administered 2023-07-21 – 2023-07-22 (×2): 10 mg via ORAL
  Filled 2023-07-20 (×2): qty 1

## 2023-07-20 MED ORDER — SODIUM CHLORIDE (PF) 0.9 % IJ SOLN
INTRAMUSCULAR | Status: DC | PRN
  Administered 2023-07-20: 100 mL via INTRAMUSCULAR

## 2023-07-20 MED ORDER — SODIUM CHLORIDE 0.9 % IV SOLN
INTRAVENOUS | Status: AC
  Filled 2023-07-20: qty 2

## 2023-07-20 MED ORDER — BUPIVACAINE LIPOSOME 1.3 % IJ SUSP
INTRAMUSCULAR | Status: AC
  Filled 2023-07-20: qty 20

## 2023-07-20 MED ORDER — PHENYLEPHRINE 80 MCG/ML (10ML) SYRINGE FOR IV PUSH (FOR BLOOD PRESSURE SUPPORT)
PREFILLED_SYRINGE | INTRAVENOUS | Status: AC
  Filled 2023-07-20: qty 10

## 2023-07-20 MED ORDER — OXYCODONE HCL 5 MG PO TABS: 5.0000 mg | ORAL_TABLET | Freq: Once | ORAL | Status: DC | PRN

## 2023-07-20 MED ORDER — MIDAZOLAM HCL 2 MG/2ML IJ SOLN
INTRAMUSCULAR | Status: DC | PRN
  Administered 2023-07-20: 2 mg via INTRAVENOUS

## 2023-07-20 MED ORDER — ALVIMOPAN 12 MG PO CAPS
12.0000 mg | ORAL_CAPSULE | Freq: Two times a day (BID) | ORAL | Status: DC
  Administered 2023-07-21 – 2023-07-22 (×3): 12 mg via ORAL
  Filled 2023-07-20 (×3): qty 1

## 2023-07-20 MED ORDER — PHENYLEPHRINE 80 MCG/ML (10ML) SYRINGE FOR IV PUSH (FOR BLOOD PRESSURE SUPPORT)
PREFILLED_SYRINGE | INTRAVENOUS | Status: DC | PRN
  Administered 2023-07-20: 160 ug via INTRAVENOUS
  Administered 2023-07-20: 240 ug via INTRAVENOUS
  Administered 2023-07-20 (×6): 160 ug via INTRAVENOUS

## 2023-07-20 MED ORDER — LIDOCAINE HCL (CARDIAC) PF 100 MG/5ML IV SOSY
PREFILLED_SYRINGE | INTRAVENOUS | Status: DC | PRN
  Administered 2023-07-20: 50 mg via INTRAVENOUS

## 2023-07-20 MED ORDER — DEXAMETHASONE SODIUM PHOSPHATE 10 MG/ML IJ SOLN
INTRAMUSCULAR | Status: DC | PRN
  Administered 2023-07-20: 10 mg via INTRAVENOUS

## 2023-07-20 MED ORDER — SUGAMMADEX SODIUM 200 MG/2ML IV SOLN
INTRAVENOUS | Status: DC | PRN
  Administered 2023-07-20: 200 mg via INTRAVENOUS

## 2023-07-20 MED ORDER — OXYCODONE HCL 5 MG/5ML PO SOLN: 5.0000 mg | Freq: Once | ORAL | Status: DC | PRN

## 2023-07-20 MED ORDER — CHLORHEXIDINE GLUCONATE 0.12 % MT SOLN
OROMUCOSAL | Status: AC
  Filled 2023-07-20: qty 15

## 2023-07-20 MED ORDER — ORAL CARE MOUTH RINSE: 15.0000 mL | Freq: Once | OROMUCOSAL | Status: AC

## 2023-07-20 MED ORDER — MORPHINE SULFATE (PF) 4 MG/ML IV SOLN: 4.0000 mg | INTRAVENOUS | Status: DC | PRN

## 2023-07-20 MED ORDER — BUPIVACAINE LIPOSOME 1.3 % IJ SUSP: 20.0000 mL | Freq: Once | INTRAMUSCULAR | Status: DC

## 2023-07-20 MED ORDER — ALVIMOPAN 12 MG PO CAPS
12.0000 mg | ORAL_CAPSULE | ORAL | Status: AC
Start: 2023-07-20 — End: 2023-07-20
  Administered 2023-07-20: 12 mg via ORAL

## 2023-07-20 MED ORDER — FENTANYL CITRATE (PF) 100 MCG/2ML IJ SOLN
INTRAMUSCULAR | Status: AC
  Filled 2023-07-20: qty 2

## 2023-07-20 MED ORDER — FENTANYL CITRATE (PF) 100 MCG/2ML IJ SOLN
INTRAMUSCULAR | Status: DC | PRN
  Administered 2023-07-20 (×2): 50 ug via INTRAVENOUS

## 2023-07-20 MED ORDER — 0.9 % SODIUM CHLORIDE (POUR BTL) OPTIME
TOPICAL | Status: DC | PRN
  Administered 2023-07-20: 500 mL

## 2023-07-20 MED ORDER — ONDANSETRON 4 MG PO TBDP: 4.0000 mg | ORAL_TABLET | Freq: Four times a day (QID) | ORAL | Status: DC | PRN

## 2023-07-20 MED ORDER — DEXAMETHASONE SODIUM PHOSPHATE 10 MG/ML IJ SOLN
INTRAMUSCULAR | Status: AC
  Filled 2023-07-20: qty 1

## 2023-07-20 MED ORDER — ONDANSETRON HCL 4 MG/2ML IJ SOLN
INTRAMUSCULAR | Status: DC | PRN
Start: 2023-07-20 — End: 2023-07-20
  Administered 2023-07-20: 4 mg via INTRAVENOUS

## 2023-07-20 MED ORDER — SODIUM CHLORIDE 0.9 % IV SOLN
INTRAVENOUS | Status: DC
Start: 2023-07-20 — End: 2023-07-21

## 2023-07-20 MED ORDER — LACTATED RINGERS IV SOLN: INTRAVENOUS | Status: DC

## 2023-07-20 MED ORDER — FENTANYL CITRATE (PF) 100 MCG/2ML IJ SOLN
25.0000 ug | INTRAMUSCULAR | Status: DC | PRN
  Administered 2023-07-20: 25 ug via INTRAVENOUS
  Administered 2023-07-20: 50 ug via INTRAVENOUS
  Administered 2023-07-20: 25 ug via INTRAVENOUS
  Administered 2023-07-20: 50 ug via INTRAVENOUS

## 2023-07-20 MED ORDER — HYDROCODONE-ACETAMINOPHEN 5-325 MG PO TABS
1.0000 | ORAL_TABLET | ORAL | Status: DC | PRN
  Administered 2023-07-20: 2 via ORAL
  Filled 2023-07-20: qty 2

## 2023-07-20 MED ORDER — EPHEDRINE SULFATE-NACL 50-0.9 MG/10ML-% IV SOSY
PREFILLED_SYRINGE | INTRAVENOUS | Status: DC | PRN
Start: 2023-07-20 — End: 2023-07-20
  Administered 2023-07-20: 5 mg via INTRAVENOUS

## 2023-07-20 MED ORDER — GABAPENTIN 300 MG PO CAPS
ORAL_CAPSULE | ORAL | Status: AC
  Filled 2023-07-20: qty 1

## 2023-07-20 MED ORDER — LIDOCAINE HCL (PF) 2 % IJ SOLN
INTRAMUSCULAR | Status: AC
  Filled 2023-07-20: qty 5

## 2023-07-20 MED ORDER — ONDANSETRON HCL 4 MG/2ML IJ SOLN: 4.0000 mg | Freq: Four times a day (QID) | INTRAMUSCULAR | Status: DC | PRN

## 2023-07-20 MED ORDER — ACETAMINOPHEN 10 MG/ML IV SOLN
INTRAVENOUS | Status: AC
  Filled 2023-07-20: qty 100

## 2023-07-20 MED ORDER — ROCURONIUM BROMIDE 10 MG/ML (PF) SYRINGE
PREFILLED_SYRINGE | INTRAVENOUS | Status: AC
  Filled 2023-07-20: qty 10

## 2023-07-20 MED ORDER — SCOPOLAMINE 1 MG/3DAYS TD PT72
MEDICATED_PATCH | TRANSDERMAL | Status: AC
  Filled 2023-07-20: qty 1

## 2023-07-20 MED ORDER — CHLORHEXIDINE GLUCONATE 0.12 % MT SOLN
15.0000 mL | Freq: Once | OROMUCOSAL | Status: AC
  Administered 2023-07-20: 15 mL via OROMUCOSAL

## 2023-07-20 MED ORDER — CELECOXIB 200 MG PO CAPS
200.0000 mg | ORAL_CAPSULE | ORAL | Status: AC
  Administered 2023-07-20: 200 mg via ORAL

## 2023-07-20 MED ORDER — GABAPENTIN 300 MG PO CAPS
300.0000 mg | ORAL_CAPSULE | Freq: Two times a day (BID) | ORAL | Status: DC
  Administered 2023-07-20 – 2023-07-22 (×4): 300 mg via ORAL
  Filled 2023-07-20 (×4): qty 1

## 2023-07-20 MED ORDER — ONDANSETRON HCL 4 MG/2ML IJ SOLN
INTRAMUSCULAR | Status: AC
  Filled 2023-07-20: qty 2

## 2023-07-20 MED ORDER — MIDAZOLAM HCL 2 MG/2ML IJ SOLN
INTRAMUSCULAR | Status: AC
  Filled 2023-07-20: qty 2

## 2023-07-20 MED ORDER — ALVIMOPAN 12 MG PO CAPS
ORAL_CAPSULE | ORAL | Status: AC
Start: 2023-07-20 — End: ?
  Filled 2023-07-20: qty 1

## 2023-07-20 MED ORDER — GABAPENTIN 300 MG PO CAPS
300.0000 mg | ORAL_CAPSULE | ORAL | Status: AC
  Administered 2023-07-20: 300 mg via ORAL

## 2023-07-20 MED ORDER — SODIUM CHLORIDE (PF) 0.9 % IJ SOLN
INTRAMUSCULAR | Status: AC
  Filled 2023-07-20: qty 50

## 2023-07-20 MED ORDER — PROPOFOL 10 MG/ML IV BOLUS
INTRAVENOUS | Status: DC | PRN
  Administered 2023-07-20: 140 mg via INTRAVENOUS

## 2023-07-20 MED ORDER — PROPOFOL 10 MG/ML IV BOLUS
INTRAVENOUS | Status: AC
  Filled 2023-07-20: qty 20

## 2023-07-20 MED ORDER — ROCURONIUM BROMIDE 100 MG/10ML IV SOLN
INTRAVENOUS | Status: DC | PRN
  Administered 2023-07-20 (×2): 20 mg via INTRAVENOUS
  Administered 2023-07-20: 50 mg via INTRAVENOUS

## 2023-07-20 MED ORDER — GLYCOPYRROLATE 0.2 MG/ML IJ SOLN
INTRAMUSCULAR | Status: DC | PRN
Start: 2023-07-20 — End: 2023-07-20
  Administered 2023-07-20: .2 mg via INTRAVENOUS

## 2023-07-20 MED ORDER — PANTOPRAZOLE SODIUM 40 MG IV SOLR
40.0000 mg | Freq: Every day | INTRAVENOUS | Status: DC
  Administered 2023-07-20 – 2023-07-21 (×2): 40 mg via INTRAVENOUS
  Filled 2023-07-20 (×2): qty 10

## 2023-07-20 MED ORDER — ACETAMINOPHEN 500 MG PO TABS
1000.0000 mg | ORAL_TABLET | ORAL | Status: AC
  Administered 2023-07-20: 1000 mg via ORAL

## 2023-07-20 MED ORDER — AMLODIPINE BESYLATE 5 MG PO TABS
5.0000 mg | ORAL_TABLET | Freq: Every day | ORAL | Status: DC
  Administered 2023-07-20: 5 mg via ORAL
  Filled 2023-07-20: qty 1

## 2023-07-20 MED ORDER — CELECOXIB 200 MG PO CAPS
ORAL_CAPSULE | ORAL | Status: AC
  Filled 2023-07-20: qty 1

## 2023-07-20 MED ORDER — BUPIVACAINE HCL (PF) 0.5 % IJ SOLN
INTRAMUSCULAR | Status: AC
  Filled 2023-07-20: qty 30

## 2023-07-20 MED ORDER — ACETAMINOPHEN 500 MG PO TABS
ORAL_TABLET | ORAL | Status: AC
  Filled 2023-07-20: qty 2

## 2023-07-20 MED ORDER — FENTANYL CITRATE (PF) 100 MCG/2ML IJ SOLN
INTRAMUSCULAR | Status: AC
Start: 2023-07-20 — End: ?
  Filled 2023-07-20: qty 2

## 2023-07-20 MED ORDER — SODIUM CHLORIDE 0.9 % IV SOLN
2.0000 g | INTRAVENOUS | Status: AC
  Administered 2023-07-20: 2 g via INTRAVENOUS

## 2023-07-20 MED ORDER — ENOXAPARIN SODIUM 40 MG/0.4ML IJ SOSY
40.0000 mg | PREFILLED_SYRINGE | INTRAMUSCULAR | Status: DC
  Administered 2023-07-21 – 2023-07-22 (×2): 40 mg via SUBCUTANEOUS
  Filled 2023-07-20 (×2): qty 0.4

## 2023-07-20 SURGICAL SUPPLY — 66 items
BLADE CLIPPER SURG (BLADE) ×1 IMPLANT
BLADE SURG SZ10 CARB STEEL (BLADE) ×1 IMPLANT
CANNULA CAP OBTURATR AIRSEAL 8 (CAP) ×1 IMPLANT
CANNULA REDUCER 12-8 DVNC XI (CANNULA) ×1 IMPLANT
CLIP APPLIE XI LRG REUSE DVNC (INSTRUMENTS) IMPLANT
CLIP LIGATING HEM O LOK PURPLE (MISCELLANEOUS) ×1 IMPLANT
COVER TIP SHEARS 8 DVNC (MISCELLANEOUS) ×1 IMPLANT
DERMABOND ADVANCED .7 DNX12 (GAUZE/BANDAGES/DRESSINGS) ×1 IMPLANT
DRAPE ARM DVNC X/XI (DISPOSABLE) ×4 IMPLANT
DRAPE COLUMN DVNC XI (DISPOSABLE) ×1 IMPLANT
DRSG OPSITE POSTOP 4X10 (GAUZE/BANDAGES/DRESSINGS) IMPLANT
DRSG OPSITE POSTOP 4X8 (GAUZE/BANDAGES/DRESSINGS) IMPLANT
ELECT BLADE 6.5 EXT (BLADE) IMPLANT
ELECTRODE REM PT RTRN 9FT ADLT (ELECTROSURGICAL) ×1 IMPLANT
FORCEPS BPLR FENES DVNC XI (FORCEP) ×1 IMPLANT
GLOVE BIO SURGEON STRL SZ 6.5 (GLOVE) ×3 IMPLANT
GLOVE BIOGEL PI IND STRL 6.5 (GLOVE) ×3 IMPLANT
GOWN STRL REUS W/ TWL LRG LVL3 (GOWN DISPOSABLE) ×6 IMPLANT
GRASPER TIP-UP FEN DVNC XI (INSTRUMENTS) ×1 IMPLANT
HANDLE YANKAUER SUCT BULB TIP (MISCELLANEOUS) ×1 IMPLANT
IRRIGATION STRYKERFLOW (MISCELLANEOUS) IMPLANT
IRRIGATOR SUCT 8 DISP DVNC XI (IRRIGATION / IRRIGATOR) IMPLANT
IV NS 1000ML BAXH (IV SOLUTION) IMPLANT
KIT IMAGING PINPOINTPAQ (MISCELLANEOUS) IMPLANT
KIT PINK PAD W/HEAD ARE REST (MISCELLANEOUS) ×1 IMPLANT
KIT PINK PAD W/HEAD ARM REST (MISCELLANEOUS) ×1 IMPLANT
LABEL OR SOLS (LABEL) IMPLANT
MANIFOLD NEPTUNE II (INSTRUMENTS) ×1 IMPLANT
NDL DRIVE SUT CUT DVNC (INSTRUMENTS) ×1 IMPLANT
NDL HYPO 22X1.5 SAFETY MO (MISCELLANEOUS) ×1 IMPLANT
NDL INSUFFLATION 14GA 120MM (NEEDLE) ×1 IMPLANT
NEEDLE DRIVE SUT CUT DVNC (INSTRUMENTS) ×1 IMPLANT
NEEDLE HYPO 22X1.5 SAFETY MO (MISCELLANEOUS) ×1 IMPLANT
NEEDLE INSUFFLATION 14GA 120MM (NEEDLE) ×1 IMPLANT
OBTURATOR OPTICALSTD 8 DVNC (TROCAR) ×1 IMPLANT
PACK COLON CLEAN CLOSURE (MISCELLANEOUS) ×1 IMPLANT
PACK LAP CHOLECYSTECTOMY (MISCELLANEOUS) ×1 IMPLANT
PENCIL SMOKE EVACUATOR COATED (MISCELLANEOUS) ×1 IMPLANT
RELOAD STAPLE 45 3.5 BLU DVNC (STAPLE) IMPLANT
RELOAD STAPLE 60 2.5 WHT DVNC (STAPLE) IMPLANT
RELOAD STAPLE 60 3.5 BLU DVNC (STAPLE) IMPLANT
RELOAD STAPLER 2.5X60 WHT DVNC (STAPLE) ×1 IMPLANT
RELOAD STAPLER 3.5X45 BLU DVNC (STAPLE) IMPLANT
RELOAD STAPLER 3.5X60 BLU DVNC (STAPLE) ×3 IMPLANT
RETRACTOR WOUND ALXS 18CM SML (MISCELLANEOUS) ×1 IMPLANT
SCISSORS MNPLR CVD DVNC XI (INSTRUMENTS) ×1 IMPLANT
SEAL UNIV 5-12 XI (MISCELLANEOUS) ×4 IMPLANT
SEALER VESSEL EXT DVNC XI (MISCELLANEOUS) IMPLANT
SET TUBE FILTERED XL AIRSEAL (SET/KITS/TRAYS/PACK) ×1 IMPLANT
SOLUTION ELECTROSURG ANTI STCK (MISCELLANEOUS) ×1 IMPLANT
SPONGE T-LAP 18X18 ~~LOC~~+RFID (SPONGE) ×1 IMPLANT
SPONGE T-LAP 4X18 ~~LOC~~+RFID (SPONGE) ×1 IMPLANT
STAPLER 45 SUREFORM DVNC (STAPLE) IMPLANT
STAPLER 60 SUREFORM DVNC (STAPLE) IMPLANT
SUT PDS PLUS AB 0 CT-2 (SUTURE) IMPLANT
SUT SILK 3 0 SH 30 (SUTURE) IMPLANT
SUT STRATA 3-0 SH (SUTURE) ×1 IMPLANT
SUT VIC AB 3-0 SH 27X BRD (SUTURE) ×1 IMPLANT
SUT VICRYL 0 UR6 27IN ABS (SUTURE) ×1 IMPLANT
SUTURE MNCRL 4-0 27XMF (SUTURE) ×2 IMPLANT
SUTURE STRATFX 0 PDS+ CT-2 23 (SUTURE) ×1 IMPLANT
SYR 20ML LL LF (SYRINGE) ×1 IMPLANT
SYR 30ML LL (SYRINGE) ×1 IMPLANT
TRAP FLUID SMOKE EVACUATOR (MISCELLANEOUS) ×1 IMPLANT
TRAY FOLEY MTR SLVR 16FR STAT (SET/KITS/TRAYS/PACK) ×1 IMPLANT
WATER STERILE IRR 500ML POUR (IV SOLUTION) ×1 IMPLANT

## 2023-07-20 NOTE — Op Note (Signed)
 Preoperative diagnosis: Right colon cancer  Postoperative diagnosis: Right colon cancer  Procedure: Robotic assisted laparoscopic right colectomy.   Anesthesia: GETA   Surgeon: [name], MD  Wound Classification: Clean contaminated   Specimen: Right colon   Complications: None   Estimated Blood Loss: 50 mL   Indications: Patient is a 73 y.o. female was found to have carcinoma involving the ascending colon. Resection was indicated for oncologic treatment.   FIndings: 1.  Ascending colon mass with tattoo 2.  Adequate hemostasis.  3.  No gross metastasis noted   Description of procedure: The patient was placed on the operating table in the supine position, both arms tucked. General anesthesia was induced. A time-out was completed verifying correct patient, procedure, site, positioning, and implant(s) and/or special equipment prior to beginning this procedure. The abdomen was prepped and draped in the usual sterile fashion.    Veress needle was inserted in the Palmer's point.  Gas insufflation was initiated until the abdominal pressure was measured at 15 mmHg.  A small incision was done in the left upper quadrant.  Access to the abdominal cavity was done in the Optiview fashion.  Afterwards, 3 additional incision was made 5 cm apart along the left side of the abdominal wall from the initial incision and two additional 8mm port and one 12 mm port were placed under direct visualization. 5 mm assistant port was then placed between 2 of the robotic ports.  No injuries from trocar placements were noted. Exparel infused on the bilateral abdominal wall. The table was placed in the reverse Trendelenburg position with the right side elevated.  Xi robotic platform was then brought to the operative field and docked at an angle from the left lower quadrant.  Tip up grasper and scissors was placed in right arm ports.  Fenestrated bipolar in left arm port.   Examination of the abdominal  cavity noted no signs of gross metastasis. I elevated the cecum and identified the ileocolic pedicle.  This was dissected circumferentially and divided with vessel sealer.  Medial to lateral dissection was started until the right ureter was identified.  Then dissection was continued in a medial to lateral fashion in the cephalad way until the duodenum was identified in the right upper quadrant.  The duodenum was carefully dissected down.  The dissection continued in a more lateral until the hepatic flexure was identified.  Then attention was on the terminal ileum.  The mesentery of the terminal ileum was dissected off to the bowel.  Then the point of transection of the transverse colon was identified.  The mesentery of the transverse colon was divided with vessel sealer being very careful with the duodenum.  Once the two-point of dissection were identified, 5 mg of ICG green were administered.  This was done to be able to identify the perfusion of the colonic flap pedicles.  Adequate ICG perfusion was identified.  The rest of the mesentery was divided with vessel sealer.  Then the ileum and the transverse colon was divided with linear stapler.   The transected specimen was placed atop the liver.  The small bowel was then brought towards the transverse colon and a isoperistaltic anastomosis was created.  Enterotomies were made in the small bowel and the transverse colon, small bowel enterotomy created 2 cm from the staple line and transverse colon enterotomy made 8 cm from the staple line.  60 mm blue load stapler placed through these enterotomies and new anastomosis created.  The enterotomy was then closed  by placing 2 anchor sutures at the 2 apexes using 2-0 Vicryl, then running Stratafix in a 2 layer fashion.   All instruments were removed and the [employer] robot was undocked.  A 3 cm incision was done on the umbilical area.  Dissection was carried down to the fascia.  The fascia was opened and abdominal cavity  was entered.  The specimen was removed.   Using a clean closure technique, the fascia was closed with 0 PDS.  All skin incisions then closed with subcuticular sutures Monocryl 4-0.  Wounds then dressed with dermabond.   The patient tolerated the procedure well, awakened from anesthesia and was taken to the postanesthesia care unit in satisfactory condition.  Foley still in place.  Sponge count and instrument count correct at the end of the procedure.

## 2023-07-20 NOTE — Anesthesia Preprocedure Evaluation (Signed)
 Anesthesia Evaluation  Patient identified by MRN, date of birth, ID band Patient awake    Reviewed: Allergy & Precautions, H&P , NPO status , Patient's Chart, lab work & pertinent test results  Airway Mallampati: III  TM Distance: >3 FB Neck ROM: full    Dental no notable dental hx. (+) Chipped   Pulmonary neg pulmonary ROS   Pulmonary exam normal        Cardiovascular Exercise Tolerance: Good hypertension, On Medications negative cardio ROS Normal cardiovascular exam  ECHO 10/24: moderate to severe AI  NORMAL LA PRESSURES WITH DIASTOLIC DYSFUNCTION (GRADE 1)  VALVULAR REGURGITATION: MODERATE AR, TRIVIAL MR, TRIVIAL PR, TRIVIAL TR  NO VALVULAR STENOSIS     Neuro/Psych    Depression    negative neurological ROS  negative psych ROS   GI/Hepatic Neg liver ROS,GERD  Medicated,,  Endo/Other  negative endocrine ROS    Renal/GU      Musculoskeletal   Abdominal   Peds  Hematology negative hematology ROS (+)   Anesthesia Other Findings Past Medical History: No date: ABLA (acute blood loss anemia) No date: Anemia No date: Cancer (HCC)     Comment:  BASAL CELL CARCINOMA No date: Depression No date: Diverticulitis No date: Dysrhythmia No date: GERD (gastroesophageal reflux disease) 03/28/2014: Heme positive stool No date: History of shingles No date: Hyperlipidemia No date: Hypertension No date: Lower GI bleed No date: Osteoporosis No date: SVT (supraventricular tachycardia) (HCC)     Comment:  TREATED WITH AV NODE ABLATION  Past Surgical History: No date: ANKLE FRACTURE SURGERY 04/27/2023: BIOPSY     Comment:  Procedure: BIOPSY;  Surgeon: Toledo, Alphonsus Jeans, MD;                Location: [facility] ENDOSCOPY;  Service: Endoscopy;; 2000: CARDIAC ELECTROPHYSIOLOGY MAPPING AND ABLATION No date: COLONOSCOPY No date: COLONOSCOPY 07/05/2023: COLONOSCOPY; N/A     Comment:  Procedure: COLONOSCOPY;  Surgeon: Toledo, Alphonsus Jeans, MD;              Location: [facility] ENDOSCOPY;  Service: Gastroenterology;                Laterality: N/A; 09/17/2019: COLONOSCOPY WITH PROPOFOL ; N/A     Comment:  Procedure: COLONOSCOPY WITH PROPOFOL ;  Surgeon: Toledo,               Alphonsus Jeans, MD;  Location: [facility] ENDOSCOPY;  Service:               Endoscopy;  Laterality: N/A; 04/27/2023: COLONOSCOPY WITH PROPOFOL ; N/A     Comment:  Procedure: COLONOSCOPY WITH PROPOFOL ;  Surgeon: Toledo,               Alphonsus Jeans, MD;  Location: [facility] ENDOSCOPY;  Service:               Endoscopy;  Laterality: N/A; No date: ESOPHAGOGASTRODUODENOSCOPY No date: EXCISION OF BASAL CELL CARCINOMA No date: FRACTURE SURGERY No date: JOINT REPLACEMENT; Left No date: TOTAL HIP ARTHROPLASTY  BMI    Body Mass Index: 23.34 kg/m      Reproductive/Obstetrics negative OB ROS                              Anesthesia Physical Anesthesia Plan  ASA: 2  Anesthesia Plan: General   Post-op Pain Management: Minimal or no pain anticipated   Induction: Intravenous  PONV Risk Score and Plan: 3 and Propofol  infusion, TIVA and Ondansetron   Airway Management Planned: Nasal Cannula  Additional Equipment: None  Intra-op Plan:   Post-operative Plan:   Informed Consent: I have reviewed the patients History and Physical, chart, labs and discussed the procedure including the risks, benefits and alternatives for the proposed anesthesia with the patient or authorized representative who has indicated his/her understanding and acceptance.     Dental advisory given  Plan Discussed with: CRNA and Surgeon  Anesthesia Plan Comments: (Discussed risks of anesthesia with patient, including possibility of difficulty with spontaneous ventilation under anesthesia necessitating airway intervention, PONV, and rare risks such as cardiac or respiratory or neurological events, and allergic reactions. Discussed the role of CRNA in patient's perioperative care.  Patient understands.)         Anesthesia Quick Evaluation

## 2023-07-20 NOTE — Interval H&P Note (Signed)
 History and Physical Interval Note:  07/20/2023 9:16 AM  Jodi Allison  has presented today for surgery, with the diagnosis of C18.2 Malignant neoplasm of ascending colon.  The various methods of treatment have been discussed with the patient and family. After consideration of risks, benefits and other options for treatment, the patient has consented to  Procedure(s): COLECTOMY, PARTIAL, ROBOT-ASSISTED, LAPAROSCOPIC (N/A) as a surgical intervention.  The patient's history has been reviewed, patient examined, no change in status, stable for surgery.  I have reviewed the patient's chart and labs.  Questions were answered to the patient's satisfaction.     [name]

## 2023-07-20 NOTE — Anesthesia Postprocedure Evaluation (Signed)
 Anesthesia Post Note  Patient: Jodi Allison  Procedure(s) Performed: COLECTOMY, PARTIAL, ROBOT-ASSISTED, LAPAROSCOPIC (Abdomen)  Patient location during evaluation: PACU Anesthesia Type: General Level of consciousness: awake and alert Pain management: pain level controlled Vital Signs Assessment: post-procedure vital signs reviewed and stable Respiratory status: spontaneous breathing, nonlabored ventilation, respiratory function stable and patient connected to nasal cannula oxygen Cardiovascular status: blood pressure returned to baseline and stable Postop Assessment: no apparent nausea or vomiting Anesthetic complications: no  No notable events documented.   Last Vitals:  Vitals:   07/20/23 1410 07/20/23 1424  BP:    Pulse: 77 77  Resp: 12 10  Temp:  36.7 C  SpO2: 97% 98%    Last Pain:  Vitals:   07/20/23 1424  TempSrc:   PainSc: 5                  Enrique Harvest

## 2023-07-20 NOTE — Anesthesia Procedure Notes (Addendum)
 Procedure Name: Intubation Date/Time: 07/20/2023 9:58 AM  Performed by: Marisue Side, CRNAPre-anesthesia Checklist: Patient identified, Patient being monitored, Timeout performed, Emergency Drugs available and Suction available Patient Re-evaluated:Patient Re-evaluated prior to induction Oxygen Delivery Method: Circle system utilized Preoxygenation: Pre-oxygenation with 100% oxygen Induction Type: IV induction Ventilation: Mask ventilation without difficulty Laryngoscope Size: 3 and McGrath Grade View: Grade I Tube type: Oral Tube size: 6.5 mm Number of attempts: 1 Airway Equipment and Method: Stylet Placement Confirmation: ETT inserted through vocal cords under direct vision, positive ETCO2 and breath sounds checked- equal and bilateral Secured at: 23 cm Tube secured with: Tape Dental Injury: Teeth and Oropharynx as per pre-operative assessment

## 2023-07-20 NOTE — Transfer of Care (Addendum)
 Immediate Anesthesia Transfer of Care Note  Patient: Jodi Allison  Procedure(s) Performed: COLECTOMY, PARTIAL, ROBOT-ASSISTED, LAPAROSCOPIC (Abdomen)  Patient Location: PACU  Anesthesia Type:General  Level of Consciousness: drowsy and patient cooperative  Airway & Oxygen Therapy: Patient Spontanous Breathing and Patient connected to nasal cannula oxygen  Post-op Assessment: Report given to RN and Post -op Vital signs reviewed and stable  Post vital signs: Reviewed and stable  Last Vitals:  Vitals Value Taken Time  BP 145/63 07/20/23 1328  Temp 37.2 C 07/20/23 1328  Pulse 89 07/20/23 1329  Resp 17 07/20/23 1329  SpO2 100 % 07/20/23 1329  Vitals shown include unfiled device data.  Last Pain:  Vitals:   07/20/23 1328  TempSrc: Tympanic  PainSc: 0-No pain         Complications: No notable events documented.

## 2023-07-21 LAB — BASIC METABOLIC PANEL WITH GFR
Anion gap: 6 (ref 5–15)
BUN: 12 mg/dL (ref 8–23)
CO2: 24 mmol/L (ref 22–32)
Calcium: 7.9 mg/dL — ABNORMAL LOW (ref 8.9–10.3)
Chloride: 109 mmol/L (ref 98–111)
Creatinine, Ser: 0.75 mg/dL (ref 0.44–1.00)
GFR, Estimated: >60 mL/min (ref >60)
Glucose, Bld: 96 mg/dL (ref 70–99)
Potassium: 3.7 mmol/L (ref 3.5–5.1)
Sodium: 139 mmol/L (ref 135–145)

## 2023-07-21 LAB — CBC
HCT: 26.7 % — ABNORMAL LOW (ref 36.0–46.0)
Hemoglobin: 8.5 g/dL — ABNORMAL LOW (ref 12.0–15.0)
MCH: 26 pg (ref 26.0–34.0)
MCHC: 31.8 g/dL (ref 30.0–36.0)
MCV: 81.7 fL (ref 80.0–100.0)
Platelets: 231 10*3/uL (ref 150–400)
RBC: 3.27 MIL/uL — ABNORMAL LOW (ref 3.87–5.11)
RDW: 15.1 % (ref 11.5–15.5)
WBC: 6.5 10*3/uL (ref 4.0–10.5)
nRBC: 0 % (ref 0.0–0.2)

## 2023-07-21 NOTE — Progress Notes (Signed)
 [facility]- General Surgery  SURGICAL PROGRESS NOTE  Hospital Day(s): 1.   Post op day(s): 1 Day Post-Op.   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  She recently underwent a colonoscopy that revealed a polypoid ulcerated nonobstructive small mass in the cecum. A biopsy confirmed the presence of invasive adenocarcinoma. No significant symptoms of blood in the stool are reported, although she initially attributed her symptoms to diverticulitis.  Her past medical history includes chronic diverticulitis, which has been a persistent issue. A previous colonoscopy showed a stricture, thought to be related to her diverticulitis, which had previously prevented a complete examination. Recent inflammation subsided enough to allow a full colonoscopy.  She has a significant family history of colon cancer, with her paternal grandmother having died from the disease at the age of 61. Due to this family history, she began regular colonoscopies at the age of 21 and has had them every five years since. Her last colonoscopy in 2021 was completed without any notable findings.  She has a history of liver cysts, which have been stable over multiple scans and are not currently causing concern. A recent CT scan of her abdomen did not show any major abnormalities, but further imaging is planned to assess for potential spread of the cancer.    Interval History:  Patient seen and examined. No acute events or new complaints overnight.  Patient reports mild abdominal discomfort. She has been tolerating clear liquid diet. She reports experiencing some flatulence this morning. No bowel movement. Denies nausea or vomiting. Overall doing well.    Vital signs in last 24 hours: [min-max] current  Temp:  [97.7 F (36.5 C)-99 F (37.2 C)] 97.7 F (36.5 C) (05/15  0418) Pulse Rate:  [72-102] 89 (05/15 0418) Resp:  [10-21] 20 (05/15 0418) BP: (105-145)/(54-64) 105/54 (05/15 0418) SpO2:  [90 %-100 %] 90 % (05/15 0418)     Height: 5\' 4"  (162.6 cm) Weight: 61.7 kg BMI (Calculated): 23.33   Intake/Output last 2 shifts:  05/14 0701 - 05/15 0700 In: 2333.9 [P.O.:150; I.V.:2083.9; IV Piggyback:100] Out: 890 [Urine:840; Blood:50]   Physical Exam:  Constitutional: alert, cooperative and no distress  Respiratory: breathing non-labored at rest  Cardiovascular: regular rate and sinus rhythm  Gastrointestinal: soft, mildly tender, and non-distended, incisions intact clean and dry, surgical glue in place. No redness or drainage  Labs:     Latest Ref Rng & Units 07/21/2023    5:05 AM 07/14/2023   12:59 PM 02/27/2023    5:42 AM  CBC  WBC 4.0 - 10.5 K/uL 6.5  6.8  5.8   Hemoglobin 12.0 - 15.0 g/dL 8.5  82.9  56.2   Hematocrit 36.0 - 46.0 % 26.7  32.0  32.4   Platelets 150 - 400 K/uL 231  308  273       Latest Ref Rng & Units 07/21/2023    5:05 AM 02/26/2023    1:10 AM 03/23/2013    5:12 AM  CMP  Glucose 70 - 99 mg/dL 96  96  99   BUN 8 - 23 mg/dL 12  26  10    Creatinine 0.44 - 1.00 mg/dL 1.30  8.65  7.84   Sodium 135 - 145 mmol/L 139  139  137   Potassium 3.5 - 5.1 mmol/L 3.7  3.6  3.5   Chloride 98 - 111 mmol/L 109  105  105   CO2 22 - 32 mmol/L 24  25  28    Calcium 8.9 - 10.3 mg/dL 7.9  8.8  8.2   Total Protein 6.5 - 8.1 g/dL  7.9    Total Bilirubin <1.2 mg/dL  0.3    Alkaline Phos 38 - 126 U/L  78    AST 15 - 41 U/L  19    ALT 0 - 44 U/L  15       Imaging studies: No new pertinent imaging studies   Assessment/Plan:  73 y.o. female with right colon cancer 1 Day Post-Op s/p  robotic assisted laparoscopic right colectomy, complicated by pertinent comorbidities including hypertension, chronic diverticulitis, GERD,hyperlipidemia, and liver cysts.    - Tolerated clear liquid diet, advanced to full liquid   - Discontinued IV fluids and foley  -  Continue DVT prophylaxis    - Encourage to ambulate    - Continue pain management    -- Satchel Heidinger Barrientos PA-C

## 2023-07-21 NOTE — Plan of Care (Signed)

## 2023-07-22 LAB — BASIC METABOLIC PANEL WITH GFR
Anion gap: 7 (ref 5–15)
BUN: 21 mg/dL (ref 8–23)
CO2: 26 mmol/L (ref 22–32)
Calcium: 8.3 mg/dL — ABNORMAL LOW (ref 8.9–10.3)
Chloride: 108 mmol/L (ref 98–111)
Creatinine, Ser: 0.81 mg/dL (ref 0.44–1.00)
GFR, Estimated: >60 mL/min (ref >60)
Glucose, Bld: 92 mg/dL (ref 70–99)
Potassium: 3.9 mmol/L (ref 3.5–5.1)
Sodium: 141 mmol/L (ref 135–145)

## 2023-07-22 LAB — CBC
HCT: 26.8 % — ABNORMAL LOW (ref 36.0–46.0)
Hemoglobin: 8.3 g/dL — ABNORMAL LOW (ref 12.0–15.0)
MCH: 25.6 pg — ABNORMAL LOW (ref 26.0–34.0)
MCHC: 31 g/dL (ref 30.0–36.0)
MCV: 82.7 fL (ref 80.0–100.0)
Platelets: 212 10*3/uL (ref 150–400)
RBC: 3.24 MIL/uL — ABNORMAL LOW (ref 3.87–5.11)
RDW: 15.3 % (ref 11.5–15.5)
WBC: 6.2 10*3/uL (ref 4.0–10.5)
nRBC: 0 % (ref 0.0–0.2)

## 2023-07-22 LAB — SURGICAL PATHOLOGY

## 2023-07-22 MED ORDER — HYDROCODONE-ACETAMINOPHEN 5-325 MG PO TABS: 1.0000 | ORAL_TABLET | Freq: Four times a day (QID) | ORAL | 0 refills | Status: AC | PRN

## 2023-07-22 NOTE — Discharge Summary (Signed)
 [facility]-General Surgery  SURGICAL DISCHARGE SUMMARY  Patient ID: IRELAND FOLLMAN MRN: [id number] DOB/AGE: 1950-04-24 73 y.o.  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 She had a robotic assisted laparoscopic right colectomy on 07/20/23 with no complications. Patient has been able to ambulate with mild abdominal discomfort. Have advanced her diet and has tolerated soft diet. Reports good bowel function, has been experiencing flatulence. Labs have been reassuring. Patient is deemed for discharge today.   Physical Examination:  Constitutional: alert, cooperative and no distress Pulmonary: normal breath sounds Cardiac: Regular rate and sinus rhythm Gastrointestinal: Soft, mildly tender, and non-distended Skin: Abdominal incisions are clean, dry and intact with surgical glue in place. No redness or drainage    Allergies as of 07/22/2023       Reactions   Lisinopril    Facial swelling    Losartan    Facial swelling    Penicillins Rash   Childhood allergy    Sulfa Antibiotics Rash   Childhood Allergy         Medication List     TAKE these medications    amLODipine 5 MG tablet Commonly known as: NORVASC Take 5 mg by mouth at bedtime.   CALCIUM + VITAMIN D3 PO Take 1 tablet by mouth 2 (two) times daily.   escitalopram  10 MG tablet Commonly known as: LEXAPRO  Take 10 mg by mouth daily.   HYDROcodone-acetaminophen  5-325  MG tablet Commonly known as: NORCO/VICODIN Take 1 tablet by mouth every 6 (six) hours as needed for up to 3 days.   pravastatin 20 MG tablet Commonly known as: PRAVACHOL Take 20 mg by mouth at bedtime.   PreserVision AREDS 2 Caps Take 1 capsule by mouth 2 (two) times daily.          Follow-up Information     [name], MD Follow up in 2 week(s).   Specialty: General Surgery Why: Follow-up for post-op right colectomy Contact information: [address] [place] [address] [phone]                  Time spent on discharge management including discussion of hospital course, clinical condition, outpatient instructions, prescriptions, and follow up with the patient and members of the medical team: >30 minutes  Jden Want Barrientos PA-C

## 2023-07-22 NOTE — Progress Notes (Signed)
 Patient discharging home via personal vehicle with spouse. Patient discharge instructions gone over and all questions answered. Patient to follow up with general surgery in 2 weeks.

## 2023-07-22 NOTE — Discharge Instructions (Addendum)
  Diet: Resume home heart healthy regular diet.   Activity: No heavy lifting >20 pounds (children, pets, laundry, garbage) or strenuous activity until follow-up, but light activity and walking are encouraged. Do not drive or drink alcohol if taking narcotic pain medications.  Wound care:  May shower with soapy water and pat dry (do not rub incisions), but no baths or submerging incision underwater until follow-up. (no swimming)   Medications: Resume all home medications. For mild to moderate pain: acetaminophen  (Tylenol ) or ibuprofen (if no kidney disease). Combining Tylenol  with alcohol can substantially increase your risk of causing liver disease. Norco which is a narcotic pain medication, can be used for severe pain, though may cause nausea, constipation, and drowsiness. Do not combine Tylenol  and Norco within a 6 hour period as Norco contains Tylenol . If you do not need the narcotic pain medication, you do not need to fill the prescription.  Call office [phone]) at any time if any questions, worsening pain, fevers/chills, bleeding, drainage from incision site, or other concerns.

## 2023-07-22 NOTE — Care Management Important Message (Signed)
 Important Message  Patient Details  Name: Jodi Allison MRN: [id number] Date of Birth: Aug 06, 1950   Important Message Given:  Yes - Medicare IM     Anise Kerns 07/22/2023, 1:16 PM

## 2023-07-27 ENCOUNTER — Inpatient Hospital Stay

## 2023-08-03 ENCOUNTER — Inpatient Hospital Stay: Admitting: Oncology

## 2023-08-03 ENCOUNTER — Encounter: Payer: Self-pay | Admitting: Oncology

## 2023-08-03 VITALS — BP 137/67 | HR 80 | Temp 97.4°F | Wt 139.3 lb

## 2023-08-03 DIAGNOSIS — C189 Malignant neoplasm of colon, unspecified: Secondary | ICD-10-CM

## 2023-08-03 DIAGNOSIS — Z809 Family history of malignant neoplasm, unspecified: Secondary | ICD-10-CM | POA: Diagnosis not present

## 2023-08-03 DIAGNOSIS — R918 Other nonspecific abnormal finding of lung field: Secondary | ICD-10-CM | POA: Insufficient documentation

## 2023-08-03 DIAGNOSIS — C180 Malignant neoplasm of cecum: Secondary | ICD-10-CM | POA: Diagnosis not present

## 2023-08-03 NOTE — Progress Notes (Signed)
 Hematology/Oncology Consult Note [phone] [phone]     REFERRING PROVIDER: Jimmy Moulding, MD    CHIEF COMPLAINTS/PURPOSE OF CONSULTATION:  Cecum adenocarcinoma  ASSESSMENT & PLAN:   Cancer Staging  Adenocarcinoma of colon [facility]) Staging form: Colon and Rectum, AJCC 8th Edition - Clinical stage from 07/07/2023: Stage Unknown (cTX, cN0, cM0) - Signed by [name], MD on 07/07/2023 - Pathologic stage from 08/03/2023: Stage I (pT1, pN0, cM0) - Signed by [name], MD on 08/03/2023   Adenocarcinoma of colon (HCC) Stage 1 T1b N0 M0 Cecum adenocarcinoma, Baseline CEA is within normal limits. S/p right hemicolectomy.  Recommend repeat colonoscopy 1 year after surgery.  Monitor clinically.   Lung nodules Repeat CT chest in 6 months.   Family history of cancer Recommend Dentist. Patient declined.     Orders Placed This Encounter  Procedures   CT Chest Wo Contrast    Standing Status:   Future    Expected Date:   01/03/2024    Expiration Date:   08/02/2024    Preferred imaging location?:   [facility]    Call Results- Best Contact Number?:   lung nodule follow up   CMP (Cancer Center only)    Standing Status:   Future    Expected Date:   02/03/2024    Expiration Date:   08/02/2024   CBC with Differential (Cancer Center Only)    Standing Status:   Future    Expected Date:   02/03/2024    Expiration Date:   08/02/2024   CEA    Standing Status:   Future    Expected Date:   02/03/2024    Expiration Date:   08/02/2024   Follow-up  6 months All questions were answered. The patient knows to call the clinic with any problems, questions or concerns.  [name], MD, PhD [facility] Hematology Oncology 08/03/2023    HISTORY OF PRESENTING ILLNESS:  Jodi Allison 73 y.o. female presents to establish care for cecum adenocarcinoma I have reviewed her chart and materials related to her cancer extensively and collaborated history with the  patient. Summary of oncologic history is as follows: Oncology History  Adenocarcinoma of colon (HCC)  05/17/2023 Imaging   CT abdomen pelvis w contrast  Mild residual sigmoid diverticulitis, improved. Additional stable ancillary findings as above.   07/06/2023 Tumor Marker   CEA done at [facility] clinic  normal.   07/07/2023 Initial Diagnosis   RADIOGRAPHIC STUDIES: I have personally reviewed the radiological images as listed and agreed with the findings in the report. Adenocarcinoma of colon   Patient experienced recent rectal bleeding, abdominal pain and loose stool since December.  Patient was seen at ED and CT suggest acute diverticulitis.  Patient was treated with antibiotics.  Symptoms have improved.  04/27/2023, patient underwent colonoscopy She was found to have nonbleeding internal hemorrhoids, congested mucosa in the distal sigmoid colon.  Stricture in the distal sigmoid colon which was biopsied.  Due to stenosis and swelling, procedure was aborted Biopsy of the stricture was benign.  07/05/2023, patient underwent colonoscopy. Findings include nonbleeding internal hemorrhoids, severe diverticulosis in the sigmoid colon, no evidence of diverticular bleeding.  Stricture of the mid sigmoid colon.  Likely malignant tumor in the cecum, nonobstructing, not circumferential.  Cecal mass was biopsied.  Cecum biopsy showed invasive moderately differentiated colorectal carcinoma.   07/07/2023 Cancer Staging   Staging form: Colon and Rectum, AJCC 8th Edition - Clinical stage from 07/07/2023: Stage Unknown (cTX, cN0, cM0) -  Signed by [name], MD on 07/07/2023 Stage prefix: Initial diagnosis Total positive nodes: 0   07/20/2023 Surgery   Patient underwent right hemicolectomy.   1. Colon, segmental resection for tumor, right :      INVASIVE ADENOCARCINOMA, MODERATELY DIFFERENFIATED, ARISING IN SESSILE SERRATED      POLYP.      TUMOR SIZE: 2.9 CM.      CARCINOMA SUPERFICALLY INVASIVE SUBMUCOSA.       NO LYMPHOVASCULAR INVASION IDENTIFIED.      SURGICAL MARGINS OF RESECTION ARE NEGATIVE FOR CARCINOMA.      VERMIFORM APPENDIX WITHOUT SIGNIFICANT DIAGNOSITC ALTERATION.      FOURTEEN LYMPH NODES, NEGATIVE FOR METASTATITC CARCINOAM (0/14).      SEE ONCOLOGY TABLE.       Diagnosis Note : COLON AND RECTUM, CARCINOMA:  Resection, Including Transanal      Disk Excision of Rectal Neoplasms      Procedure: Segmental resection of right colon      Tumor Site: Cecum      Tumor Size: 2.9 cm      Macroscopic Tumor Perforation: Not identified      Macroscopic Evaluation of Mesorectum (required for rectal cancer): NA      Histologic Type: Invasive adenocarcinoma      Histologic Grade: Moderately differentiated      Multiple Primary Sites: Not applicable      Tumor Extension: Tumor invades submucosa      Lymphovascular Invasion: Not identified      Perineural Invasion: Not identified      Treatment Effect: No known presurgical therapy      Margins:      Margin Status for Invasive Carcinoma: All margins negative for invasive      carcinoma      Distance from Invasive Carcinoma to Radial (Circumferential) Margin (required      for rectal      tumors): Not applicable (not a rectal tumor)      Distance from Invasive Carcinoma to Closest Mucosal Margin (relevant and      required only for      transanal disc excisions): Not applicable (not a transanal disc excision)      Margin Status for Non-Invasive Tumor: All margins negative for high-grade      dysplasia / intramucosal      carcinoma and low-grade dysplasia      Regional Lymph Nodes:      Number of Lymph Nodes with Tumor: 0      Number of Lymph Nodes Examined: 14      Tumor Deposits: Not identified      Distant Metastasis:      Distant Site(s) Involved: Not applicable      Pathologic Stage Classification (pTNM, AJCC 8th Edition): pT1, pN0      Ancillary Studies: MMR / MSI testing will be ordered.      Representative Tumor Block: 1/I, 1/G       Comments: None      ([id number])      08/03/2023 Cancer Staging   Staging form: Colon and Rectum, AJCC 8th Edition - Pathologic stage from 08/03/2023: Stage I (pT1, pN0, cM0) - Signed by [name], MD on 08/03/2023 Stage prefix: Initial diagnosis Total positive nodes: 0    Patient was accompanied by husband  She is s/p right hemicolectomy. Recovers from surgery well. She has not noticed any bowel habit changes. No new complaints.  Family history positive for paternal grandmother with history of colon cancer, diagnosed in her  40s.  Patient has a personal history of colon polyps.  Mother with history of breast cancer.  Maternal grandmother with uterine cancer.  MEDICAL HISTORY:  Past Medical History:  Diagnosis Date   ABLA (acute blood loss anemia)    Anemia    Cancer (HCC)    BASAL CELL CARCINOMA   Depression    Diverticulitis    Dysrhythmia    GERD (gastroesophageal reflux disease)    Heme positive stool 03/28/2014   History of shingles    Hyperlipidemia    Hypertension    Lower GI bleed    Osteoporosis    SVT (supraventricular tachycardia) (HCC)    TREATED WITH AV NODE ABLATION    SURGICAL HISTORY: Past Surgical History:  Procedure Laterality Date   ANKLE FRACTURE SURGERY     BIOPSY  04/27/2023   Procedure: BIOPSY;  Surgeon: Corky Diener, Alphonsus Jeans, MD;  Location: [facility] ENDOSCOPY;  Service: Endoscopy;;   CARDIAC ELECTROPHYSIOLOGY MAPPING AND ABLATION  2000   COLONOSCOPY     COLONOSCOPY     COLONOSCOPY N/A 07/05/2023   Procedure: COLONOSCOPY;  Surgeon: Toledo, Alphonsus Jeans, MD;  Location: [facility] ENDOSCOPY;  Service: Gastroenterology;  Laterality: N/A;   COLONOSCOPY WITH PROPOFOL  N/A 09/17/2019   Procedure: COLONOSCOPY WITH PROPOFOL ;  Surgeon: Toledo, Alphonsus Jeans, MD;  Location: [facility] ENDOSCOPY;  Service: Endoscopy;  Laterality: N/A;   COLONOSCOPY WITH PROPOFOL  N/A 04/27/2023   Procedure: COLONOSCOPY WITH PROPOFOL ;  Surgeon: Toledo, Alphonsus Jeans, MD;  Location: [facility] ENDOSCOPY;  Service:  Endoscopy;  Laterality: N/A;   ESOPHAGOGASTRODUODENOSCOPY     EXCISION OF BASAL CELL CARCINOMA     FRACTURE SURGERY     JOINT REPLACEMENT Left    TOTAL HIP ARTHROPLASTY      SOCIAL HISTORY: Social History   Socioeconomic History   Marital status: Widowed    Spouse name: Not on file   Number of children: Not on file   Years of education: Not on file   Highest education level: Not on file  Occupational History   Not on file  Tobacco Use   Smoking status: Never   Smokeless tobacco: Never  Vaping Use   Vaping status: Never Used  Substance and Sexual Activity   Alcohol use: Yes    Alcohol/week: 4.0 standard drinks of alcohol    Types: 2 Glasses of wine, 2 Standard drinks or equivalent per week    Comment: none last 24 hrs   Drug use: Never   Sexual activity: Not on file  Other Topics Concern   Not on file  Social History Narrative   Not on file   Social Drivers of Health   Financial Resource Strain: Low Risk  (07/06/2023)   Received from [facility]   Overall Financial Resource Strain (CARDIA)    Difficulty of Paying Living Expenses: Not hard at all  Food Insecurity: No Food Insecurity (07/20/2023)   Hunger Vital Sign    Worried About Running Out of Food in the Last Year: Never true    Ran Out of Food in the Last Year: Never true  Transportation Needs: Patient Declined (07/20/2023)   PRAPARE - Administrator, Civil Service (Medical): Patient declined    Lack of Transportation (Non-Medical): Patient declined  Physical Activity: Not on file  Stress: Not on file  Social Connections: Unknown (07/20/2023)   Social Connection and Isolation Panel [NHANES]    Frequency of Communication with Friends and Family: Never    Frequency of Social Gatherings with Friends  and Family: Never    Attends Religious Services: Never    Active Member of Clubs or Organizations: Yes    Attends Banker Meetings: Patient declined    Marital Status:  Patient declined  Intimate Partner Violence: Not At Risk (07/20/2023)   Humiliation, Afraid, Rape, and Kick questionnaire    Fear of Current or Ex-Partner: No    Emotionally Abused: No    Physically Abused: No    Sexually Abused: No    FAMILY HISTORY: Family History  Problem Relation Age of Onset   Breast cancer Mother 50   Uterine cancer Maternal Grandmother    Colon cancer Paternal Grandmother     ALLERGIES:  is allergic to lisinopril, losartan, penicillins, and sulfa antibiotics.  MEDICATIONS:  Current Outpatient Medications  Medication Sig Dispense Refill   amLODipine  (NORVASC ) 5 MG tablet Take 5 mg by mouth at bedtime.     Calcium Carb-Cholecalciferol (CALCIUM + VITAMIN D3 PO) Take 1 tablet by mouth 2 (two) times daily.     escitalopram  (LEXAPRO ) 10 MG tablet Take 10 mg by mouth daily.     Multiple Vitamins-Minerals (PRESERVISION AREDS 2) CAPS Take 1 capsule by mouth 2 (two) times daily.     pravastatin (PRAVACHOL) 20 MG tablet Take 20 mg by mouth at bedtime.     No current facility-administered medications for this visit.    Review of Systems  Constitutional:  Negative for appetite change, chills, fatigue and fever.  HENT:   Negative for hearing loss and voice change.   Eyes:  Negative for eye problems.  Respiratory:  Negative for chest tightness and cough.   Cardiovascular:  Negative for chest pain.  Gastrointestinal:  Negative for abdominal distention, abdominal pain and blood in stool.  Endocrine: Negative for hot flashes.  Genitourinary:  Negative for difficulty urinating and frequency.   Musculoskeletal:  Negative for arthralgias.  Skin:  Negative for itching and rash.  Neurological:  Negative for extremity weakness.  Hematological:  Negative for adenopathy.  Psychiatric/Behavioral:  Negative for confusion.      PHYSICAL EXAMINATION: ECOG PERFORMANCE STATUS: 0 - Asymptomatic  Vitals:   08/03/23 0906  BP: 137/67  Pulse: 80  Temp: (!) 97.4 F (36.3 C)   SpO2: 100%   Filed Weights   08/03/23 0906  Weight: 139 lb 4.8 oz (63.2 kg)    Physical Exam Constitutional:      General: She is not in acute distress.    Appearance: She is not diaphoretic.  HENT:     Head: Normocephalic and atraumatic.  Eyes:     General: No scleral icterus. Cardiovascular:     Rate and Rhythm: Normal rate and regular rhythm.     Heart sounds: No murmur heard. Pulmonary:     Effort: Pulmonary effort is normal. No respiratory distress.     Breath sounds: No wheezing.  Abdominal:     General: There is no distension.     Palpations: Abdomen is soft.  Musculoskeletal:        General: Normal range of motion.     Cervical back: Normal range of motion and neck supple.  Skin:    General: Skin is warm and dry.     Findings: No erythema.  Neurological:     Mental Status: She is alert and oriented to person, place, and time. Mental status is at baseline.     Cranial Nerves: No cranial nerve deficit.     Motor: No abnormal muscle tone.  Psychiatric:  Mood and Affect: Mood and affect normal.      LABORATORY DATA:  I have reviewed the data as listed    Latest Ref Rng & Units 07/22/2023    5:45 AM 07/21/2023    5:05 AM 07/14/2023   12:59 PM  CBC  WBC 4.0 - 10.5 K/uL 6.2  6.5  6.8   Hemoglobin 12.0 - 15.0 g/dL 8.3  8.5  16.1   Hematocrit 36.0 - 46.0 % 26.8  26.7  32.0   Platelets 150 - 400 K/uL 212  231  308       Latest Ref Rng & Units 07/22/2023    5:45 AM 07/21/2023    5:05 AM 02/26/2023    1:10 AM  CMP  Glucose 70 - 99 mg/dL 92  96  96   BUN 8 - 23 mg/dL 21  12  26    Creatinine 0.44 - 1.00 mg/dL 0.96  0.45  4.09   Sodium 135 - 145 mmol/L 141  139  139   Potassium 3.5 - 5.1 mmol/L 3.9  3.7  3.6   Chloride 98 - 111 mmol/L 108  109  105   CO2 22 - 32 mmol/L 26  24  25    Calcium 8.9 - 10.3 mg/dL 8.3  7.9  8.8   Total Protein 6.5 - 8.1 g/dL   7.9   Total Bilirubin <1.2 mg/dL   0.3   Alkaline Phos 38 - 126 U/L   78   AST 15 - 41 U/L   19   ALT  0 - 44 U/L   15      RADIOGRAPHIC STUDIES: I have personally reviewed the radiological images as listed and agreed with the findings in the report. CT CHEST W CONTRAST Result Date: 07/21/2023 CLINICAL DATA:  Adenocarcinoma of the colon.  * Tracking Code: BO * EXAM: CT CHEST WITH CONTRAST TECHNIQUE: Multidetector CT imaging of the chest was performed during intravenous contrast administration. RADIATION DOSE REDUCTION: This exam was performed according to the departmental dose-optimization program which includes automated exposure control, adjustment of the mA and/or kV according to patient size and/or use of iterative reconstruction technique. CONTRAST:  OMNIPAQUE  IOHEXOL  300 MG/ML  SOLN COMPARISON:  None Available. FINDINGS: Cardiovascular: Heart nonenlarged. No pericardial effusion. Thoracic aorta is normal course and caliber. Mild atherosclerotic plaque identified along the aorta. Mediastinum/Nodes: Preserved thyroid gland. No specific abnormal lymph node enlargement identified in the axillary regions, hilum or mediastinum. Normal caliber thoracic esophagus. Lungs/Pleura: Linear opacity along bases likely scar or atelectasis. Elevation the right hemidiaphragm. There is a small right upper lobe nodule identified measuring 7 mm on series 2, image 61. This may be along the course of the bronchus. 2 mm nodule lingula posteriorly series 2, image 90. 3 mm nodule just above the left hemidiaphragm image 111 of series 2. Upper Abdomen: Please see separate taken of abdomen pelvis CT from same day dictated separately. Musculoskeletal: Curvature and degenerative changes along the spine. Elevation of the right hemidiaphragm. Nodular right lateral breast tissue identified. Please correlate with prior mammography of 04/21/2023 IMPRESSION: Few small bilateral lung nodules. Largest is seen measuring 7 mm right upper lobe. Please correlate with any prior to assess stability recommend follow up in 3-6 months in the  setting of colon cancer. No developing abnormal lymph node enlargement. Elevated right hemidiaphragm. Please correlate with separate taken of abdomen and pelvis CT from same day. Aortic Atherosclerosis (ICD10-I70.0). Electronically Signed   By: Otho Blitz.D.  On: 07/21/2023 12:56   CT ABDOMEN PELVIS W CONTRAST Result Date: 07/14/2023 CLINICAL DATA:  Cecal adenocarcinoma.  Surgical consultation. EXAM: CT ABDOMEN AND PELVIS WITH CONTRAST TECHNIQUE: Multidetector CT imaging of the abdomen and pelvis was performed using the standard protocol following bolus administration of intravenous contrast. RADIATION DOSE REDUCTION: This exam was performed according to the departmental dose-optimization program which includes automated exposure control, adjustment of the mA and/or kV according to patient size and/or use of iterative reconstruction technique. CONTRAST:  OMNIPAQUE  IOHEXOL  300 MG/ML  SOLN COMPARISON:  CT abdomen and pelvis 06/04/2023. FINDINGS: Lower chest: No acute abnormality. Refer to the dedicated CT chest obtained concurrently for relevant intrathoracic findings. Hepatobiliary: Normal liver size and contour. Numerous hepatic cysts. No enhancing liver lesion. Normal gallbladder. No biliary ductal dilation. Pancreas: Unremarkable. No pancreatic ductal dilatation or surrounding inflammatory changes. Spleen: Normal in size without focal abnormality. Adrenals/Urinary Tract: Normal adrenal glands bilaterally. Numerous renal cortical cysts bilaterally. No hydronephrosis, nephrolithiasis, or enhancing lesion. Unremarkable urinary bladder. Stomach/Bowel: Normal stomach and small bowel. No obstruction. Colonic diverticulosis. No pericolonic inflammation. Enhancing intraluminal cecal mass measuring approximately 2.0 x 1.7 x 2.3 cm (2:38, 5:33). Normal appendix. Vascular/Lymphatic: Aortic atherosclerosis. No enlarged abdominal or pelvic lymph nodes. Reproductive: Uterus and bilateral adnexa are unremarkable.  Other: No abdominal wall hernia or abnormality. No abdominopelvic ascites. Musculoskeletal: No acute or significant osseous findings. Multilevel degenerative changes. Left hip total arthroplasty. IMPRESSION: 1. Enhancing intraluminal cecal mass corresponding with the prior colonoscopy finding. 2. No evidence of metastatic disease in the abdomen or pelvis. Electronically Signed   By: Rox Cope M.D.   On: 07/14/2023 12:12

## 2023-08-03 NOTE — Assessment & Plan Note (Signed)
Repeat CT chest in 6 months.

## 2023-08-03 NOTE — Assessment & Plan Note (Signed)
 Recommend Dentist. Patient declined.

## 2023-08-03 NOTE — Assessment & Plan Note (Addendum)
 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 S/p right hemicolectomy.  Recommend repeat colonoscopy 1 year after surgery.  Monitor clinically.

## 2024-01-10 ENCOUNTER — Ambulatory Visit
Admission: RE | Admit: 2024-01-10 | Discharge: 2024-01-10 | Disposition: A | Discharge status: Home / Self Care | Source: Ambulatory Visit | Attending: Gastroenterology | Admitting: Gastroenterology

## 2024-01-10 ENCOUNTER — Other Ambulatory Visit: Payer: Self-pay | Admitting: Gastroenterology

## 2024-01-10 DIAGNOSIS — R1084 Generalized abdominal pain: Secondary | ICD-10-CM

## 2024-01-10 DIAGNOSIS — R197 Diarrhea, unspecified: Secondary | ICD-10-CM | POA: Diagnosis present

## 2024-01-10 DIAGNOSIS — R110 Nausea: Secondary | ICD-10-CM | POA: Insufficient documentation

## 2024-01-10 MED ORDER — IOHEXOL 300 MG/ML  SOLN
100.0000 mL | Freq: Once | INTRAMUSCULAR | Status: AC | PRN
  Administered 2024-01-10: 100 mL via INTRAVENOUS

## 2024-01-23 ENCOUNTER — Other Ambulatory Visit: Payer: Self-pay | Admitting: Gastroenterology

## 2024-01-23 DIAGNOSIS — N289 Disorder of kidney and ureter, unspecified: Secondary | ICD-10-CM

## 2024-01-26 ENCOUNTER — Other Ambulatory Visit

## 2024-01-27 ENCOUNTER — Ambulatory Visit
Admission: RE | Admit: 2024-01-27 | Discharge: 2024-01-27 | Disposition: A | Discharge status: Home / Self Care | Source: Ambulatory Visit | Attending: Gastroenterology | Admitting: Gastroenterology

## 2024-01-27 ENCOUNTER — Other Ambulatory Visit: Payer: Self-pay | Admitting: Gastroenterology

## 2024-01-27 DIAGNOSIS — N289 Disorder of kidney and ureter, unspecified: Secondary | ICD-10-CM | POA: Diagnosis present

## 2024-01-27 MED ORDER — GADOBUTROL 1 MMOL/ML IV SOLN
6.0000 mL | Freq: Once | INTRAVENOUS | Status: AC | PRN
  Administered 2024-01-27: 6 mL via INTRAVENOUS

## 2024-01-30 ENCOUNTER — Ambulatory Visit
Admission: RE | Admit: 2024-01-30 | Discharge: 2024-01-30 | Disposition: A | Discharge status: Home / Self Care | Source: Ambulatory Visit | Attending: Oncology | Admitting: Oncology

## 2024-01-30 DIAGNOSIS — R918 Other nonspecific abnormal finding of lung field: Secondary | ICD-10-CM | POA: Diagnosis present

## 2024-02-07 ENCOUNTER — Inpatient Hospital Stay: Admitting: Oncology

## 2024-02-07 ENCOUNTER — Other Ambulatory Visit: Payer: Self-pay

## 2024-02-07 ENCOUNTER — Ambulatory Visit: Payer: Self-pay | Admitting: Oncology

## 2024-02-07 ENCOUNTER — Inpatient Hospital Stay: Attending: Oncology

## 2024-02-07 ENCOUNTER — Encounter: Payer: Self-pay | Admitting: Oncology

## 2024-02-07 VITALS — BP 137/67 | HR 76 | Temp 98.5°F | Resp 18 | Wt 137.3 lb

## 2024-02-07 DIAGNOSIS — K869 Disease of pancreas, unspecified: Secondary | ICD-10-CM | POA: Insufficient documentation

## 2024-02-07 DIAGNOSIS — M810 Age-related osteoporosis without current pathological fracture: Secondary | ICD-10-CM | POA: Insufficient documentation

## 2024-02-07 DIAGNOSIS — C180 Malignant neoplasm of cecum: Secondary | ICD-10-CM | POA: Diagnosis present

## 2024-02-07 DIAGNOSIS — R918 Other nonspecific abnormal finding of lung field: Secondary | ICD-10-CM

## 2024-02-07 DIAGNOSIS — D509 Iron deficiency anemia, unspecified: Secondary | ICD-10-CM | POA: Insufficient documentation

## 2024-02-07 DIAGNOSIS — Z8049 Family history of malignant neoplasm of other genital organs: Secondary | ICD-10-CM | POA: Diagnosis not present

## 2024-02-07 DIAGNOSIS — Z809 Family history of malignant neoplasm, unspecified: Secondary | ICD-10-CM | POA: Diagnosis not present

## 2024-02-07 DIAGNOSIS — Z803 Family history of malignant neoplasm of breast: Secondary | ICD-10-CM | POA: Insufficient documentation

## 2024-02-07 DIAGNOSIS — C189 Malignant neoplasm of colon, unspecified: Secondary | ICD-10-CM | POA: Diagnosis not present

## 2024-02-07 DIAGNOSIS — Z79899 Other long term (current) drug therapy: Secondary | ICD-10-CM | POA: Insufficient documentation

## 2024-02-07 DIAGNOSIS — Z800 Family history of malignant neoplasm of digestive organs: Secondary | ICD-10-CM | POA: Insufficient documentation

## 2024-02-07 DIAGNOSIS — Z85828 Personal history of other malignant neoplasm of skin: Secondary | ICD-10-CM | POA: Diagnosis not present

## 2024-02-07 LAB — CBC WITH DIFFERENTIAL (CANCER CENTER ONLY)
Abs Immature Granulocytes: 0.02 K/uL (ref 0.00–0.07)
Basophils Absolute: 0.1 K/uL (ref 0.0–0.1)
Basophils Relative: 1 %
Eosinophils Absolute: 0.1 K/uL (ref 0.0–0.5)
Eosinophils Relative: 2 %
HCT: 32.1 % — ABNORMAL LOW (ref 36.0–46.0)
Hemoglobin: 10 g/dL — ABNORMAL LOW (ref 12.0–15.0)
Immature Granulocytes: 0 %
Lymphocytes Relative: 25 %
Lymphs Abs: 1.7 K/uL (ref 0.7–4.0)
MCH: 22.6 pg — ABNORMAL LOW (ref 26.0–34.0)
MCHC: 31.2 g/dL (ref 30.0–36.0)
MCV: 72.6 fL — ABNORMAL LOW (ref 80.0–100.0)
Monocytes Absolute: 0.6 K/uL (ref 0.1–1.0)
Monocytes Relative: 9 %
Neutro Abs: 4.3 K/uL (ref 1.7–7.7)
Neutrophils Relative %: 63 %
Platelet Count: 278 K/uL (ref 150–400)
RBC: 4.42 MIL/uL (ref 3.87–5.11)
RDW: 20.3 % — ABNORMAL HIGH (ref 11.5–15.5)
WBC Count: 6.8 K/uL (ref 4.0–10.5)
nRBC: 0 % (ref 0.0–0.2)

## 2024-02-07 LAB — IRON AND TIBC
Iron: 35 ug/dL (ref 28–170)
Saturation Ratios: 9 % — ABNORMAL LOW (ref 10.4–31.8)
TIBC: 400 ug/dL (ref 250–450)
UIBC: 366 ug/dL

## 2024-02-07 LAB — CMP (CANCER CENTER ONLY)
ALT: 11 U/L (ref 0–44)
AST: 20 U/L (ref 15–41)
Albumin: 4.3 g/dL (ref 3.5–5.0)
Alkaline Phosphatase: 75 U/L (ref 38–126)
Anion gap: 14 (ref 5–15)
BUN: 30 mg/dL — ABNORMAL HIGH (ref 8–23)
CO2: 24 mmol/L (ref 22–32)
Calcium: 9.5 mg/dL (ref 8.9–10.3)
Chloride: 105 mmol/L (ref 98–111)
Creatinine: 0.83 mg/dL (ref 0.44–1.00)
GFR, Estimated: >60 mL/min (ref >60)
Glucose, Bld: 92 mg/dL (ref 70–99)
Potassium: 4.2 mmol/L (ref 3.5–5.1)
Sodium: 142 mmol/L (ref 135–145)
Total Bilirubin: 0.3 mg/dL (ref 0.0–1.2)
Total Protein: 7.5 g/dL (ref 6.5–8.1)

## 2024-02-07 LAB — FERRITIN: Ferritin: 21 ng/mL (ref 11–307)

## 2024-02-07 NOTE — Assessment & Plan Note (Addendum)
 Microcytic anemia, add Iron panel.  Lab Results  Component Value Date   HGB 10.0 (L) 02/07/2024   TIBC 400 02/07/2024   IRONPCTSAT 9 (L) 02/07/2024   FERRITIN 21 02/07/2024   Likely due to recent diarrhea.  I discussed about option of continue oral iron supplementation and repeat blood work for evaluation of treatment response.  If no significant improvement, then proceed with IV Venofer treatments. Alternative option of proceed with IV Venofer treatments. I discussed about the potential risks including but not limited to allergic reactions/infusion reactions including anaphylactic reactions, diarrhea, phlebitis, high blood pressure, wheezing, SOB, skin rash, weight gain,dark urine, leg swelling, back pain, headache, nausea and fatigue, etc. Patient undecided during today's encounter. Waiting for iron panel results.

## 2024-02-07 NOTE — Progress Notes (Signed)
 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 S/p right hemicolectomy.  Recommend repeat colonoscopy 1 year after surgery- April 2026.  Monitor clinically.   Family history of cancer Previously recommended genetic counselor. Patient declined.    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  Iron deficiency anemia Microcytic anemia, add Iron panel.  Lab Results  Component Value Date   HGB 10.0 (L) 02/07/2024   TIBC 400 02/07/2024   IRONPCTSAT 9 (L) 02/07/2024   FERRITIN 21 02/07/2024   Likely due to recent diarrhea.  I discussed about option of continue oral iron supplementation and repeat blood work for evaluation of treatment response.  If no significant improvement, then proceed with IV Venofer treatments. Alternative option of proceed with IV Venofer treatments. I discussed about the potential risks including but not limited to allergic reactions/infusion reactions including anaphylactic reactions, diarrhea, phlebitis, high blood pressure, wheezing, SOB, skin rash, weight gain,dark urine, leg swelling, back pain, headache, nausea and fatigue, etc. Patient undecided during today's encounter. Waiting for iron panel results.     Orders Placed This Encounter  Procedures   CBC with Differential (Cancer Center Only)     Standing Status:   Future    Expected Date:   08/07/2024    Expiration Date:   11/05/2024   CMP (Cancer Center only)    Standing Status:   Future    Expected Date:   08/07/2024    Expiration Date:   11/05/2024   CEA    Standing Status:   Future    Expected Date:   08/07/2024    Expiration Date:   11/05/2024   Ferritin    Standing Status:   Future    Number of Occurrences:   1    Expected Date:   02/07/2024    Expiration Date:   05/07/2024   Iron and TIBC    Standing Status:   Future    Number of Occurrences:   1    Expected Date:   02/07/2024    Expiration Date:   05/07/2024   Follow-up  6 months All questions were answered. The patient knows to call the clinic with any problems, questions or concerns.  Call Babara, MD, PhD [facility] Hematology Oncology 02/07/2024    HISTORY OF PRESENTING ILLNESS:  Jodi Allison 73 y.o. female presents to establish care for cecum adenocarcinoma I have reviewed her chart and materials related to her cancer extensively and collaborated history with the patient. Summary of oncologic history is as follows: Oncology History  Adenocarcinoma of colon (HCC)  05/17/2023 Imaging   CT abdomen pelvis w contrast  Mild residual sigmoid diverticulitis, improved. Additional stable ancillary findings as above.   07/06/2023 Tumor Marker   CEA done at [facility] clinic  normal.   07/07/2023 Initial Diagnosis   RADIOGRAPHIC STUDIES: I have personally reviewed the radiological images as  listed and agreed with the findings in the report. Adenocarcinoma of colon   Patient experienced recent rectal bleeding, abdominal pain and loose stool since December.  Patient was seen at ED and CT suggest acute diverticulitis.  Patient was treated with antibiotics.  Symptoms have improved.  04/27/2023, patient underwent colonoscopy She was found to have nonbleeding internal hemorrhoids, congested mucosa in the distal sigmoid colon.  Stricture in the distal sigmoid colon which was biopsied.  Due to  stenosis and swelling, procedure was aborted Biopsy of the stricture was benign.  07/05/2023, patient underwent colonoscopy. Findings include nonbleeding internal hemorrhoids, severe diverticulosis in the sigmoid colon, no evidence of diverticular bleeding.  Stricture of the mid sigmoid colon.  Likely malignant tumor in the cecum, nonobstructing, not circumferential.  Cecal mass was biopsied.  Cecum biopsy showed invasive moderately differentiated colorectal carcinoma.   07/07/2023 Cancer Staging   Staging form: Colon and Rectum, AJCC 8th Edition - Clinical stage from 07/07/2023: Stage Unknown (cTX, cN0, cM0) - Signed by Babara Call, MD on 07/07/2023 Stage prefix: Initial diagnosis Total positive nodes: 0   07/20/2023 Surgery   Patient underwent right hemicolectomy.   1. Colon, segmental resection for tumor, right :      INVASIVE ADENOCARCINOMA, MODERATELY DIFFERENFIATED, ARISING IN SESSILE SERRATED      POLYP.      TUMOR SIZE: 2.9 CM.      CARCINOMA SUPERFICALLY INVASIVE SUBMUCOSA.      NO LYMPHOVASCULAR INVASION IDENTIFIED.      SURGICAL MARGINS OF RESECTION ARE NEGATIVE FOR CARCINOMA.      VERMIFORM APPENDIX WITHOUT SIGNIFICANT DIAGNOSITC ALTERATION.      FOURTEEN LYMPH NODES, NEGATIVE FOR METASTATITC CARCINOAM (0/14).      SEE ONCOLOGY TABLE.       Diagnosis Note : COLON AND RECTUM, CARCINOMA:  Resection, Including Transanal      Disk Excision of Rectal Neoplasms      Procedure: Segmental resection of right colon      Tumor Site: Cecum      Tumor Size: 2.9 cm      Macroscopic Tumor Perforation: Not identified      Macroscopic Evaluation of Mesorectum (required for rectal cancer): NA      Histologic Type: Invasive adenocarcinoma      Histologic Grade: Moderately differentiated      Multiple Primary Sites: Not applicable      Tumor Extension: Tumor invades submucosa      Lymphovascular Invasion: Not identified      Perineural Invasion: Not identified      Treatment Effect: No known  presurgical therapy      Margins:      Margin Status for Invasive Carcinoma: All margins negative for invasive      carcinoma      Distance from Invasive Carcinoma to Radial (Circumferential) Margin (required      for rectal      tumors): Not applicable (not a rectal tumor)      Distance from Invasive Carcinoma to Closest Mucosal Margin (relevant and      required only for      transanal disc excisions): Not applicable (not a transanal disc excision)      Margin Status for Non-Invasive Tumor: All margins negative for high-grade      dysplasia / intramucosal      carcinoma and low-grade dysplasia      Regional Lymph Nodes:      Number of Lymph Nodes with Tumor: 0      Number  of Lymph Nodes Examined: 14      Tumor Deposits: Not identified      Distant Metastasis:      Distant Site(s) Involved: Not applicable      Pathologic Stage Classification (pTNM, AJCC 8th Edition): pT1, pN0      Ancillary Studies: MMR / MSI testing will be ordered.      Representative Tumor Block: 1/I, 1/G      Comments: None      ([id number])      08/03/2023 Cancer Staging   Staging form: Colon and Rectum, AJCC 8th Edition - Pathologic stage from 08/03/2023: Stage I (pT1, pN0, cM0) - Signed by Babara Call, MD on 08/03/2023 Stage prefix: Initial diagnosis Total positive nodes: 0     Family history positive for paternal grandmother with history of colon cancer, diagnosed in her 66s.  Patient has a personal history of colon polyps.  Mother with history of breast cancer.  Maternal grandmother with uterine cancer.  Patient previously declined genetic testing. Today patient reports feeling well. Patient was accompanied by husband  During the interval, she has had developed diarrhea in November 2025.  Patient was seen by gastroenterology and had workup. 01/10/2024, CT abdomen pelvis with contrast showed wall thickening and inflammation of descending and sigmoid colon with adjacent lymph nodes most consistent with  nonspecific colitis. She has right renal lesion with new perilesional stranding and a possible wall thickening.  MRI abdomen was recommended.  01/27/2024, MRI abdomen MRCP with and without contrast showed 1. Polycystic kidneys. Many of the cysts are hemorrhagic/proteinaceous, including the queried lesion in the posterior interpolar right kidney measuring 3.9 x 2.8 cm. No suspicious renal masses identified. No specific follow-up imaging recommended. 2. Innumerable hepatic cystic foci, the majority of which containing thin internal septations. Scattered hemorrhagic/proteinaceous components. 3. 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 No main ductal dilation, mass lesion, or abnormal enhancement. Recommend follow-up pre and post contrast MRI/MRCP in 2 years.  Patient reports that diarrhea has resolved now. No new complaints.    MEDICAL HISTORY:  Past Medical History:  Diagnosis Date   ABLA (acute blood loss anemia)    Anemia    Cancer (HCC)    BASAL CELL CARCINOMA   Depression    Diverticulitis    Dysrhythmia    GERD (gastroesophageal reflux disease)    Heme positive stool 03/28/2014   History of shingles    Hyperlipidemia    Hypertension    Lower GI bleed    Osteoporosis    SVT (supraventricular tachycardia)    TREATED WITH AV NODE ABLATION    SURGICAL HISTORY: Past Surgical History:  Procedure Laterality Date   ANKLE FRACTURE SURGERY     BIOPSY  04/27/2023   Procedure: BIOPSY;  Surgeon: Aundria, Ladell POUR, MD;  Location: [facility] ENDOSCOPY;  Service: Endoscopy;;   CARDIAC ELECTROPHYSIOLOGY MAPPING AND ABLATION  2000   COLONOSCOPY     COLONOSCOPY     COLONOSCOPY N/A 07/05/2023   Procedure: COLONOSCOPY;  Surgeon: Toledo, Ladell POUR, MD;  Location: [facility] ENDOSCOPY;  Service: Gastroenterology;  Laterality: N/A;   COLONOSCOPY WITH  PROPOFOL  N/A 09/17/2019   Procedure: COLONOSCOPY WITH PROPOFOL ;  Surgeon: Toledo, Ladell POUR, MD;  Location: [facility] ENDOSCOPY;  Service: Endoscopy;  Laterality: N/A;   COLONOSCOPY WITH PROPOFOL  N/A 04/27/2023   Procedure: COLONOSCOPY WITH PROPOFOL ;  Surgeon: Aundria, Teodoro K, MD;  Location:  [facility] ENDOSCOPY;  Service: Endoscopy;  Laterality: N/A;   ESOPHAGOGASTRODUODENOSCOPY     EXCISION OF BASAL CELL CARCINOMA     FRACTURE SURGERY     JOINT REPLACEMENT Left    TOTAL HIP ARTHROPLASTY      SOCIAL HISTORY: Social History   Socioeconomic History   Marital status: Widowed    Spouse name: Not on file   Number of children: Not on file   Years of education: Not on file   Highest education level: Not on file  Occupational History   Not on file  Tobacco Use   Smoking status: Never   Smokeless tobacco: Never  Vaping Use   Vaping status: Never Used  Substance and Sexual Activity   Alcohol use: Yes    Alcohol/week: 4.0 standard drinks of alcohol    Types: 2 Glasses of wine, 2 Standard drinks or equivalent per week    Comment: none last 24 hrs   Drug use: Never   Sexual activity: Not on file  Other Topics Concern   Not on file  Social History Narrative   Not on file   Social Drivers of Health   Financial Resource Strain: Low Risk  (12/05/2023)   Received from [facility]   Overall Financial Resource Strain (CARDIA)    Difficulty of Paying Living Expenses: Not hard at all  Food Insecurity: No Food Insecurity (12/05/2023)   Received from [facility]   Hunger Vital Sign    Within the past 12 months, you worried that your food would run out before you got the money to buy more.: Never true    Within the past 12 months, the food you bought just didn't last and you didn't have money to get more.: Never true  Transportation Needs: No Transportation Needs (12/05/2023)   Received from [facility]    In the past 12  months, has lack of transportation kept you from medical appointments or from getting medications?: No    Lack of Transportation (Non-Medical): No  Physical Activity: Not on file  Stress: Not on file  Social Connections: Unknown (07/20/2023)   Social Connection and Isolation Panel    Frequency of Communication with Friends and Family: Never    Frequency of Social Gatherings with Friends and Family: Never    Attends Religious Services: Never    Database Administrator or Organizations: Yes    Attends Banker Meetings: Patient declined    Marital Status: Patient declined  Intimate Partner Violence: Not At Risk (07/20/2023)   Humiliation, Afraid, Rape, and Kick questionnaire    Fear of Current or Ex-Partner: No    Emotionally Abused: No    Physically Abused: No    Sexually Abused: No    FAMILY HISTORY: Family History  Problem Relation Age of Onset   Breast cancer Mother 38   Uterine cancer Maternal Grandmother    Colon cancer Paternal Grandmother     ALLERGIES:  is allergic to lisinopril, losartan, penicillins, and sulfa antibiotics.  MEDICATIONS:  Current Outpatient Medications  Medication Sig Dispense Refill   amLODipine  (NORVASC ) 5 MG tablet Take 5 mg by mouth at bedtime.     Calcium Carb-Cholecalciferol (CALCIUM + VITAMIN D3 PO) Take 1 tablet by mouth 2 (two) times daily.     escitalopram  (LEXAPRO ) 10 MG tablet Take 10 mg by mouth daily.     Multiple Vitamins-Minerals (PRESERVISION AREDS 2) CAPS Take 1 capsule by mouth  2 (two) times daily.     No current facility-administered medications for this visit.    Review of Systems  Constitutional:  Negative for appetite change, chills, fatigue and fever.  HENT:   Negative for hearing loss and voice change.   Eyes:  Negative for eye problems.  Respiratory:  Negative for chest tightness and cough.   Cardiovascular:  Negative for chest pain.  Gastrointestinal:  Negative for abdominal distention, abdominal pain and  blood in stool.  Endocrine: Negative for hot flashes.  Genitourinary:  Negative for difficulty urinating and frequency.   Musculoskeletal:  Negative for arthralgias.  Skin:  Negative for itching and rash.  Neurological:  Negative for extremity weakness.  Hematological:  Negative for adenopathy.  Psychiatric/Behavioral:  Negative for confusion.      PHYSICAL EXAMINATION: ECOG PERFORMANCE STATUS: 0 - Asymptomatic  Vitals:   02/07/24 1311 02/07/24 1320  BP: (!) 143/62 137/67  Pulse: 77 76  Resp: 18   Temp: 98.5 F (36.9 C)    Filed Weights   02/07/24 1311  Weight: 137 lb 4.8 oz (62.3 kg)    Physical Exam Constitutional:      General: She is not in acute distress.    Appearance: She is not diaphoretic.  HENT:     Head: Normocephalic and atraumatic.  Eyes:     General: No scleral icterus. Cardiovascular:     Rate and Rhythm: Normal rate and regular rhythm.     Heart sounds: No murmur heard. Pulmonary:     Effort: Pulmonary effort is normal. No respiratory distress.     Breath sounds: No wheezing.  Abdominal:     General: There is no distension.     Palpations: Abdomen is soft.  Musculoskeletal:        General: Normal range of motion.     Cervical back: Normal range of motion and neck supple.  Skin:    General: Skin is warm and dry.     Findings: No erythema.  Neurological:     Mental Status: She is alert and oriented to person, place, and time. Mental status is at baseline.     Cranial Nerves: No cranial nerve deficit.     Motor: No abnormal muscle tone.  Psychiatric:        Mood and Affect: Mood and affect normal.      LABORATORY DATA:  I have reviewed the data as listed    Latest Ref Rng & Units 02/07/2024   12:45 PM 07/22/2023    5:45 AM 07/21/2023    5:05 AM  CBC  WBC 4.0 - 10.5 K/uL 6.8  6.2  6.5   Hemoglobin 12.0 - 15.0 g/dL 89.9  8.3  8.5   Hematocrit 36.0 - 46.0 % 32.1  26.8  26.7   Platelets 150 - 400 K/uL 278  212  231       Latest Ref Rng &  Units 02/07/2024   12:45 PM 07/22/2023    5:45 AM 07/21/2023    5:05 AM  CMP  Glucose 70 - 99 mg/dL 92  92  96   BUN 8 - 23 mg/dL 30  21  12    Creatinine 0.44 - 1.00 mg/dL 9.16  9.18  9.24   Sodium 135 - 145 mmol/L 142  141  139   Potassium 3.5 - 5.1 mmol/L 4.2  3.9  3.7   Chloride 98 - 111 mmol/L 105  108  109   CO2 22 - 32 mmol/L 24  26  24   Calcium 8.9 - 10.3 mg/dL 9.5  8.3  7.9   Total Protein 6.5 - 8.1 g/dL 7.5     Total Bilirubin 0.0 - 1.2 mg/dL 0.3     Alkaline Phos 38 - 126 U/L 75     AST 15 - 41 U/L 20     ALT 0 - 44 U/L 11        RADIOGRAPHIC STUDIES: I have personally reviewed the radiological images as listed and agreed with the findings in the report. CT Chest Wo Contrast Result Date: 02/04/2024 CLINICAL DATA:  Follow-up lung nodule EXAM: CT CHEST WITHOUT CONTRAST TECHNIQUE: Multidetector CT imaging of the chest was performed following the standard protocol without IV contrast. RADIATION DOSE REDUCTION: This exam was performed according to the departmental dose-optimization program which includes automated exposure control, adjustment of the mA and/or kV according to patient size and/or use of iterative reconstruction technique. COMPARISON:  Chest CT 07/12/2023. FINDINGS: Cardiovascular: No significant vascular findings. Normal heart size. No pericardial effusion. Mediastinum/Nodes: No enlarged mediastinal or axillary lymph nodes. Thyroid gland, trachea, and esophagus demonstrate no significant findings. Lungs/Pleura: Lungs are clear. No pleural effusion or pneumothorax. There are few scattered right upper lobe pulmonary nodules which are unchanged from prior. The largest measures 3 mm and has decreased in size from prior study. There are minimal ground-glass opacities and tree-in-bud opacities in the right upper lobe, likely infectious/inflammatory. No new pulmonary nodule, pleural effusion or pneumothorax. Upper Abdomen: There are numerous low-attenuation lesions throughout the  liver, some of which are complex with septations and calcifications. These are favored as cysts and unchanged. Innumerable bilateral renal lesions likely represent cysts and hyperdense cysts, also unchanged. Musculoskeletal: Degenerative changes affect the spine. IMPRESSION: 1. Stable right upper lobe pulmonary nodules measuring up to 3 mm. No follow-up needed if patient is low-risk (and has no known or suspected primary neoplasm). Non-contrast chest CT can be considered in 12 months if patient is high-risk. This recommendation follows the consensus statement: Guidelines for Management of Incidental Pulmonary Nodules Detected on CT Images: From the Fleischner Society 2017; Radiology 2017; 284:228-243. 2. Minimal ground-glass and tree-in-bud opacities in the right upper lobe, likely infectious/inflammatory. 3. Stable hepatic and renal cysts. Electronically Signed   By: Greig Pique M.D.   On: 02/04/2024 21:43   MR ABDOMEN MRCP W WO CONTAST Result Date: 01/27/2024 CLINICAL DATA:  Enlarging right renal lesion EXAM: MRI ABDOMEN WITHOUT AND WITH CONTRAST (INCLUDING MRCP) TECHNIQUE: Multiplanar multisequence MR imaging of the abdomen was performed both before and after the administration of intravenous contrast. Heavily T2-weighted images of the biliary and pancreatic ducts were obtained. Post-processing was applied at the acquisition scanner with concurrent physician supervision which includes 3D reconstructions, MIPs, volume rendered images and/or shaded surface rendering. CONTRAST:  6mL GADAVIST  GADOBUTROL  1 MMOL/ML IV SOLN COMPARISON:  CT abdomen and pelvis dated 01/10/2024 and multiple priors FINDINGS: Lower chest: No acute findings. Hepatobiliary: Innumerable hepatic cystic foci, the majority of which containing thin internal septations. Index lesion along peripheral segment 7 measuring 6.0 x 3.7 cm (4:7), not substantially changed in size dating back to 02/26/2023. Scattered intrinsic T1 hyperintense  hemorrhagic/proteinaceous components, for example along the inferior aspect of the index lesion, there is a 2.8 x 2.4 cm component (18:20). No bile duct dilation. Normal gallbladder. Pancreas: Scattered T2 hyperintense foci within the pancreatic head, uncinate process, neck, and tail measuring up to 1.3 x 0.9 cm (4:24). No main ductal dilation, mass lesion, or abnormal enhancement. Spleen:  Within normal limits in size and appearance. Adrenals/Urinary Tract: No adrenal nodules. No hydronephrosis. Multifocal bilateral renal cysts, many of which demonstrate intrinsic T1 hyperintensity, including the queried lesion in the posterior interpolar right kidney measuring 3.9 x 2.8 cm (18:54). No abnormal enhancement. Stomach/Bowel: Visualized portions within the abdomen are unremarkable. Vascular/Lymphatic: No pathologically enlarged lymph nodes identified. No abdominal aortic aneurysm demonstrated. Other:  None. Musculoskeletal: No suspicious bone lesions identified. IMPRESSION: 1. Polycystic kidneys. Many of the cysts are hemorrhagic/proteinaceous, including the queried lesion in the posterior interpolar right kidney measuring 3.9 x 2.8 cm. No suspicious renal masses identified. No specific follow-up imaging recommended. 2. Innumerable hepatic cystic foci, the majority of which containing thin internal septations. Scattered hemorrhagic/proteinaceous components. 3. 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 No main ductal dilation, mass lesion, or abnormal enhancement. Recommend follow-up pre and post contrast MRI/MRCP in 2 years. Electronically Signed   By: Limin  Xu M.D.   On: 01/27/2024 16:51   MR 3D Recon At Scanner Result Date: 01/27/2024 CLINICAL DATA:  Enlarging right renal lesion EXAM: MRI ABDOMEN WITHOUT AND WITH CONTRAST (INCLUDING MRCP) TECHNIQUE: Multiplanar  multisequence MR imaging of the abdomen was performed both before and after the administration of intravenous contrast. Heavily T2-weighted images of the biliary and pancreatic ducts were obtained. Post-processing was applied at the acquisition scanner with concurrent physician supervision which includes 3D reconstructions, MIPs, volume rendered images and/or shaded surface rendering. CONTRAST:  6mL GADAVIST  GADOBUTROL  1 MMOL/ML IV SOLN COMPARISON:  CT abdomen and pelvis dated 01/10/2024 and multiple priors FINDINGS: Lower chest: No acute findings. Hepatobiliary: Innumerable hepatic cystic foci, the majority of which containing thin internal septations. Index lesion along peripheral segment 7 measuring 6.0 x 3.7 cm (4:7), not substantially changed in size dating back to 02/26/2023. Scattered intrinsic T1 hyperintense hemorrhagic/proteinaceous components, for example along the inferior aspect of the index lesion, there is a 2.8 x 2.4 cm component (18:20). No bile duct dilation. Normal gallbladder. Pancreas: Scattered T2 hyperintense foci within the pancreatic head, uncinate process, neck, and tail measuring up to 1.3 x 0.9 cm (4:24). No main ductal dilation, mass lesion, or abnormal enhancement. Spleen:  Within normal limits in size and appearance. Adrenals/Urinary Tract: No adrenal nodules. No hydronephrosis. Multifocal bilateral renal cysts, many of which demonstrate intrinsic T1 hyperintensity, including the queried lesion in the posterior interpolar right kidney measuring 3.9 x 2.8 cm (18:54). No abnormal enhancement. Stomach/Bowel: Visualized portions within the abdomen are unremarkable. Vascular/Lymphatic: No pathologically enlarged lymph nodes identified. No abdominal aortic aneurysm demonstrated. Other:  None. Musculoskeletal: No suspicious bone lesions identified. IMPRESSION: 1. Polycystic kidneys. Many of the cysts are hemorrhagic/proteinaceous, including the queried lesion in the posterior interpolar right  kidney measuring 3.9 x 2.8 cm. No suspicious renal masses identified. No specific follow-up imaging recommended. 2. Innumerable hepatic cystic foci, the majority of which containing thin internal septations. Scattered hemorrhagic/proteinaceous components. 3. 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 No main ductal dilation, mass lesion, or abnormal enhancement. Recommend follow-up pre and post contrast MRI/MRCP in 2 years. Electronically Signed   By: Limin  Xu M.D.   On: 01/27/2024 16:51   CT ABDOMEN PELVIS W CONTRAST Result Date: 01/10/2024 EXAM: CT ABDOMEN AND PELVIS WITH CONTRAST 01/10/2024 04:32:07 PM TECHNIQUE: CT of  the abdomen and pelvis was performed with the administration of 100 mL of iohexol  (OMNIPAQUE ) 300 MG/ML solution. Multiplanar reformatted images are provided for review. Automated exposure control, iterative reconstruction, and/or weight-based adjustment of the mA/kV was utilized to reduce the radiation dose to as low as reasonably achievable. COMPARISON: CT abdomen and pelvis 07/12/2023. CLINICAL HISTORY: History of cecal adenocarcinoma. FINDINGS: LOWER CHEST: Stable rounded soft tissue mass in the lateral right breast measuring 18 mm. LIVER: There are innumerable cystic structures in the liver similar to the prior study. These are complex with multiple septations and lobulation, for example, in the form of liver measuring up to 6.1 cm. Appearance is similar to prior. There is a left hepatic opacity. GALLBLADDER AND BILE DUCTS: Gallbladder is unremarkable. No biliary ductal dilatation. SPLEEN: No acute abnormality. PANCREAS: No acute abnormality. ADRENAL GLANDS: No acute abnormality. KIDNEYS, URETERS AND BLADDER: Numerous bilateral renal cysts and mildly hyperdense cysts in both kidneys. Rounded hypoechoic structure in the right kidney measuring 4  cm posteriorly (image 2/35). This has increased in size in the interval and there is new trace surrounding stranding and questionable wall thickening. Bilateral ureters are decompressed and not well evaluated. No stones in the kidneys or ureters. No hydronephrosis. No perinephric or periureteral stranding. Urinary bladder is unremarkable. GI AND BOWEL: Stomach demonstrates no acute abnormality. There are air-fluid levels. Partial right colostomy changes are noted from prior. There is wall thickening and inflammation of the descending colon and sigmoid colon with surrounding small lymph nodes. No perforation or abscess. There is sigmoid colon diverticulosis. The appendix is surgically absent. There is no bowel obstruction. PERITONEUM AND RETROPERITONEUM: There is a new small amount of free fluid in the pelvis. No free air. VASCULATURE: Aorta is normal in caliber. LYMPH NODES: Surrounding small lymph nodes are seen adjacent to the descending and sigmoid colon. REPRODUCTIVE ORGANS: Uterus is not seen. BONES AND SOFT TISSUES: Degenerative changes of the thoracic spine. No acute osseous abnormality. No focal soft tissue abnormality. IMPRESSION: 1. Wall thickening and inflammation of the descending and sigmoid colon with small adjacent lymph nodes most consistent with nonspecific colitis. No perforation or abscess. There is also sigmoid colon diverticulosis. 2. Enlarging 4 cm right renal lesion with new perilesional stranding and possible wall thickening; indeterminate. Recommend prompt renal protocol MRI or CT without and with contrast to evaluate for enhancement; urology referral if enhancing or suspicious. 3. New small volume free fluid in the pelvis. 4. History of cecal adenocarcinoma. No evidence of metastatic disease in the abdomen or pelvis. 5. Multiple bilateral renal cysts including mildly hyperdense cysts. 6. Innumerable hepatic cysts, some complex with septations and lobulation, up to 6.1 cm, unchanged from  prior. 7. Stable 18 mm rounded soft tissue mass in the lateral right breast. Electronically signed by: Greig Pique MD 01/10/2024 05:18 PM EST RP Workstation: [id number]

## 2024-02-07 NOTE — Addendum Note (Signed)
 Addended by: BABARA CALL on: 02/07/2024 07:54 PM   Modules accepted: Orders

## 2024-02-07 NOTE — Assessment & Plan Note (Signed)
 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

## 2024-02-07 NOTE — Assessment & Plan Note (Signed)
 Previously recommended dentist. Patient declined.

## 2024-02-07 NOTE — Assessment & Plan Note (Addendum)
 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 S/p right hemicolectomy.  Recommend repeat colonoscopy 1 year after surgery- April 2026.  Monitor clinically.

## 2024-02-07 NOTE — Assessment & Plan Note (Signed)
 CT chest showed Stable right upper lobe pulmonary nodules

## 2024-02-08 LAB — CEA: CEA: 2.4 ng/mL (ref 0.0–4.7)

## 2024-03-05 ENCOUNTER — Inpatient Hospital Stay

## 2024-03-05 VITALS — BP 148/67 | HR 82 | Temp 98.2°F | Resp 18

## 2024-03-05 DIAGNOSIS — C180 Malignant neoplasm of cecum: Secondary | ICD-10-CM | POA: Diagnosis not present

## 2024-03-05 DIAGNOSIS — D500 Iron deficiency anemia secondary to blood loss (chronic): Secondary | ICD-10-CM

## 2024-03-05 MED ORDER — IRON SUCROSE 20 MG/ML IV SOLN
200.0000 mg | Freq: Once | INTRAVENOUS | Status: AC
  Administered 2024-03-05: 200 mg via INTRAVENOUS
  Filled 2024-03-05: qty 10

## 2024-03-05 NOTE — Patient Instructions (Signed)

## 2024-03-12 ENCOUNTER — Inpatient Hospital Stay: Attending: Oncology

## 2024-03-12 VITALS — BP 118/55 | HR 82 | Temp 97.6°F

## 2024-03-12 DIAGNOSIS — D509 Iron deficiency anemia, unspecified: Secondary | ICD-10-CM | POA: Diagnosis present

## 2024-03-12 DIAGNOSIS — Z85038 Personal history of other malignant neoplasm of large intestine: Secondary | ICD-10-CM | POA: Insufficient documentation

## 2024-03-12 DIAGNOSIS — D500 Iron deficiency anemia secondary to blood loss (chronic): Secondary | ICD-10-CM

## 2024-03-12 MED ORDER — SODIUM CHLORIDE 0.9% FLUSH
10.0000 mL | Freq: Once | INTRAVENOUS | Status: AC | PRN
  Administered 2024-03-12: 10 mL
  Filled 2024-03-12: qty 10

## 2024-03-12 MED ORDER — IRON SUCROSE 20 MG/ML IV SOLN
200.0000 mg | Freq: Once | INTRAVENOUS | Status: AC
  Administered 2024-03-12: 200 mg via INTRAVENOUS

## 2024-03-12 NOTE — Progress Notes (Signed)
 Patient tolerated Venofer  infusion well. Explained recommendation of 30 min post monitoring. Patient refused to wait post monitoring. Educated on what signs to watch for & to call with any concerns. No questions, discharged. Stable

## 2024-03-12 NOTE — Patient Instructions (Signed)

## 2024-03-19 ENCOUNTER — Inpatient Hospital Stay

## 2024-03-19 VITALS — BP 138/67 | HR 74 | Resp 18

## 2024-03-19 DIAGNOSIS — D500 Iron deficiency anemia secondary to blood loss (chronic): Secondary | ICD-10-CM

## 2024-03-19 DIAGNOSIS — D509 Iron deficiency anemia, unspecified: Secondary | ICD-10-CM | POA: Diagnosis not present

## 2024-03-19 MED ORDER — IRON SUCROSE 20 MG/ML IV SOLN
200.0000 mg | Freq: Once | INTRAVENOUS | Status: AC
  Administered 2024-03-19: 200 mg via INTRAVENOUS
  Filled 2024-03-19: qty 10

## 2024-03-19 NOTE — Patient Instructions (Signed)

## 2024-08-07 ENCOUNTER — Inpatient Hospital Stay

## 2024-08-07 ENCOUNTER — Inpatient Hospital Stay: Admitting: Oncology
